# Patient Record
Sex: Female | Born: 1965 | Race: White | Hispanic: No | Marital: Married | Smoking: Never smoker
Health system: Southern US, Community
[De-identification: ages and names within clinical notes are randomized; demographics above are authoritative.]

## PROBLEM LIST (undated history)

## (undated) DIAGNOSIS — K8020 Calculus of gallbladder without cholecystitis without obstruction: Secondary | ICD-10-CM

## (undated) DIAGNOSIS — G4700 Insomnia, unspecified: Secondary | ICD-10-CM

## (undated) DIAGNOSIS — G43839 Menstrual migraine, intractable, without status migrainosus: Secondary | ICD-10-CM

## (undated) DIAGNOSIS — K219 Gastro-esophageal reflux disease without esophagitis: Secondary | ICD-10-CM

## (undated) DIAGNOSIS — M329 Systemic lupus erythematosus, unspecified: Secondary | ICD-10-CM

## (undated) DIAGNOSIS — F419 Anxiety disorder, unspecified: Secondary | ICD-10-CM

## (undated) DIAGNOSIS — J45909 Unspecified asthma, uncomplicated: Secondary | ICD-10-CM

## (undated) DIAGNOSIS — J309 Allergic rhinitis, unspecified: Secondary | ICD-10-CM

## (undated) HISTORY — DX: Insomnia, unspecified: G47.00

## (undated) HISTORY — DX: Gastro-esophageal reflux disease without esophagitis: K21.9

## (undated) HISTORY — DX: Menstrual migraine, intractable, without status migrainosus: G43.839

## (undated) HISTORY — PX: URETHRAL DILATION: SUR417

## (undated) HISTORY — PX: TONSILLECTOMY: SUR1361

---

## 1998-08-27 ENCOUNTER — Encounter: Admission: RE | Admit: 1998-08-27 | Discharge: 1998-09-03 | Payer: Self-pay | Admitting: Family Medicine

## 2003-01-06 ENCOUNTER — Encounter: Admission: RE | Admit: 2003-01-06 | Discharge: 2003-01-06 | Payer: Self-pay | Admitting: Family Medicine

## 2003-01-06 ENCOUNTER — Other Ambulatory Visit: Admission: RE | Admit: 2003-01-06 | Discharge: 2003-01-06 | Payer: Self-pay | Admitting: Obstetrics and Gynecology

## 2003-01-06 ENCOUNTER — Encounter: Payer: Self-pay | Admitting: Family Medicine

## 2004-01-13 ENCOUNTER — Inpatient Hospital Stay (HOSPITAL_COMMUNITY): Admission: AD | Admit: 2004-01-13 | Discharge: 2004-01-16 | Payer: Self-pay | Admitting: Obstetrics and Gynecology

## 2004-03-22 ENCOUNTER — Other Ambulatory Visit: Admission: RE | Admit: 2004-03-22 | Discharge: 2004-03-22 | Payer: Self-pay | Admitting: Obstetrics and Gynecology

## 2004-09-16 ENCOUNTER — Ambulatory Visit: Payer: Self-pay | Admitting: Family Medicine

## 2005-04-06 ENCOUNTER — Other Ambulatory Visit: Admission: RE | Admit: 2005-04-06 | Discharge: 2005-04-06 | Payer: Self-pay | Admitting: Obstetrics and Gynecology

## 2006-04-02 ENCOUNTER — Ambulatory Visit: Payer: Self-pay | Admitting: Family Medicine

## 2006-10-29 ENCOUNTER — Encounter: Payer: Self-pay | Admitting: Family Medicine

## 2006-10-29 DIAGNOSIS — R1011 Right upper quadrant pain: Secondary | ICD-10-CM | POA: Insufficient documentation

## 2006-10-30 ENCOUNTER — Encounter: Admission: RE | Admit: 2006-10-30 | Discharge: 2006-10-30 | Payer: Self-pay | Admitting: Family Medicine

## 2006-10-30 ENCOUNTER — Encounter: Payer: Self-pay | Admitting: Family Medicine

## 2007-07-16 ENCOUNTER — Ambulatory Visit: Payer: Self-pay | Admitting: Family Medicine

## 2007-07-16 DIAGNOSIS — G43839 Menstrual migraine, intractable, without status migrainosus: Secondary | ICD-10-CM | POA: Insufficient documentation

## 2007-07-16 HISTORY — DX: Menstrual migraine, intractable, without status migrainosus: G43.839

## 2007-07-22 ENCOUNTER — Encounter: Payer: Self-pay | Admitting: Family Medicine

## 2007-07-22 DIAGNOSIS — R197 Diarrhea, unspecified: Secondary | ICD-10-CM | POA: Insufficient documentation

## 2007-07-22 DIAGNOSIS — R109 Unspecified abdominal pain: Secondary | ICD-10-CM | POA: Insufficient documentation

## 2007-07-22 LAB — CONVERTED CEMR LAB
Alkaline Phosphatase: 48 units/L (ref 39–117)
Bilirubin, Direct: 0.1 mg/dL (ref 0.0–0.3)
Tissue Transglutaminase Ab, IgA: 0.8 units (ref <7)
Total Protein: 7.3 g/dL (ref 6.0–8.3)

## 2007-08-27 ENCOUNTER — Encounter: Payer: Self-pay | Admitting: Family Medicine

## 2008-05-29 HISTORY — PX: BREAST SURGERY: SHX581

## 2008-06-18 ENCOUNTER — Encounter: Admission: RE | Admit: 2008-06-18 | Discharge: 2008-06-18 | Payer: Self-pay | Admitting: Obstetrics and Gynecology

## 2008-07-07 ENCOUNTER — Encounter (INDEPENDENT_AMBULATORY_CARE_PROVIDER_SITE_OTHER): Payer: Self-pay | Admitting: Plastic Surgery

## 2008-07-07 ENCOUNTER — Ambulatory Visit (HOSPITAL_BASED_OUTPATIENT_CLINIC_OR_DEPARTMENT_OTHER): Admission: RE | Admit: 2008-07-07 | Discharge: 2008-07-08 | Payer: Self-pay | Admitting: Plastic Surgery

## 2009-05-16 ENCOUNTER — Telehealth: Payer: Self-pay | Admitting: Family Medicine

## 2009-06-29 ENCOUNTER — Encounter: Payer: Self-pay | Admitting: Family Medicine

## 2009-06-29 ENCOUNTER — Telehealth: Payer: Self-pay | Admitting: Family Medicine

## 2009-06-29 DIAGNOSIS — S86819A Strain of other muscle(s) and tendon(s) at lower leg level, unspecified leg, initial encounter: Secondary | ICD-10-CM

## 2009-06-29 DIAGNOSIS — S838X9A Sprain of other specified parts of unspecified knee, initial encounter: Secondary | ICD-10-CM | POA: Insufficient documentation

## 2009-07-01 ENCOUNTER — Encounter: Payer: Self-pay | Admitting: Family Medicine

## 2009-07-01 ENCOUNTER — Encounter: Admission: RE | Admit: 2009-07-01 | Discharge: 2009-07-07 | Payer: Self-pay | Admitting: Family Medicine

## 2009-07-05 ENCOUNTER — Encounter: Payer: Self-pay | Admitting: Family Medicine

## 2009-07-06 ENCOUNTER — Ambulatory Visit: Payer: Self-pay | Admitting: Sports Medicine

## 2009-07-06 DIAGNOSIS — M79609 Pain in unspecified limb: Secondary | ICD-10-CM | POA: Insufficient documentation

## 2009-11-23 ENCOUNTER — Encounter: Payer: Self-pay | Admitting: Family Medicine

## 2010-01-04 ENCOUNTER — Encounter (INDEPENDENT_AMBULATORY_CARE_PROVIDER_SITE_OTHER): Payer: Self-pay | Admitting: *Deleted

## 2010-01-12 ENCOUNTER — Telehealth: Payer: Self-pay | Admitting: Family Medicine

## 2010-01-25 ENCOUNTER — Encounter (INDEPENDENT_AMBULATORY_CARE_PROVIDER_SITE_OTHER): Payer: Self-pay | Admitting: *Deleted

## 2010-01-25 ENCOUNTER — Ambulatory Visit: Payer: Self-pay | Admitting: Family Medicine

## 2010-01-25 DIAGNOSIS — M25569 Pain in unspecified knee: Secondary | ICD-10-CM | POA: Insufficient documentation

## 2010-01-27 ENCOUNTER — Ambulatory Visit: Payer: Self-pay | Admitting: Sports Medicine

## 2010-01-27 DIAGNOSIS — R269 Unspecified abnormalities of gait and mobility: Secondary | ICD-10-CM | POA: Insufficient documentation

## 2010-02-17 ENCOUNTER — Ambulatory Visit: Payer: Self-pay | Admitting: Sports Medicine

## 2010-04-18 ENCOUNTER — Telehealth: Payer: Self-pay | Admitting: Family Medicine

## 2010-04-18 ENCOUNTER — Encounter: Payer: Self-pay | Admitting: Family Medicine

## 2010-06-28 NOTE — Miscellaneous (Signed)
Summary: PT Initial Summary/[facility]  PT Initial Summary/[facility]   Imported By: Lanelle Bal 07/07/2009 11:49:32  _____________________________________________________________________  External Attachment:    Type:   Image     Comment:   External Document

## 2010-06-28 NOTE — Assessment & Plan Note (Signed)
Summary: ORTHOTICS,MC   Vital Signs:  Patient profile:   45 year old female Pulse rate:   81 / minute BP sitting:   108 / 74  (right arm) CC: orthotics- knee pain   CC:  orthotics- knee pain.  History of Present Illness: Madison Nguyen returns for orthotics has bilat knee pain w running or w dance activities we placed her in temp orthoitcs and in patellar straps these help however for dance feels she needs more support for feet to lessen sxs  Allergies: 1)  ! Pcn 2)  ! Iodine  Physical Exam  General:  Well-developed,well-nourished,in no acute distress; alert,appropriate and cooperative throughout examination Msk:  valgus shift of RT knee pronation bilat  RT > LT early bunion shift of great toe bilat gait shows horizontal translation of knees bilat   Impression & Recommendations:  Problem # 1:  ABNORMALITY OF GAIT (ICD-781.2)  Patient was fitted for a standard, cushioned, semi-rigid orthotic.  The orthotic was heated and the patient stood on the orthotic blank positioned on the orthotic stand. The patient was positioned in subtalar neutral position and 10 degrees of ankle dorsiflexion in a weight bearing stance. After completion of molding a stable based was applied to the orthotic blank.   The blank was ground to a stable position for weight bearing. size 7 black stripe base blue med eva posting  none additional orthotic padding  none  reck in 3 to 4 mos  time 40 mins  Orders: [employer] Materials, each unit [phone])  Problem # 2:  KNEE PAIN, BILATERAL (ICD-719.46)  hopefully will get more support w orthotics  note genu valgus is improved and gait shows much less pronation and horizontal shift of knees p completion of these  cont knee straps exercises  Orders: Orthotic Materials, each unit (H4742)  Complete Medication List: 1)  Madison Nguyen 0.1-20 Mg-mcg Tabs (Levonorgestrel-ethinyl estrad) .Marland Kitchen.. 1 by mouth once daily 2)  Omeprazole 20 Mg Cpdr (Omeprazole) .Marland Kitchen.. 1  by mouth once daily 3)  Klonopin 0.5 Mg Tabs (Clonazepam) .Marland Kitchen.. 1 by mouth at bedtime as needed 4)  Zyrtec Allergy 10 Mg Tbdp (Cetirizine hcl) .Marland Kitchen.. 1 by mouth once daily in allergy season 5)  Neurontin 300 Mg Caps (Gabapentin) .Marland Kitchen.. 1 by mouth at bedtime for migraine prophylaxis

## 2010-06-28 NOTE — Miscellaneous (Signed)
Summary: PPD  PPD   Imported By: Beau Fanny 11/25/2009 15:28:34  _____________________________________________________________________  External Attachment:    Type:   Image     Comment:   External Document

## 2010-06-28 NOTE — Miscellaneous (Signed)
Summary: Discharge Summary for [facility]  Discharge Summary for [facility]   Imported By: Beau Fanny 07/14/2009 14:55:39  _____________________________________________________________________  External Attachment:    Type:   Image     Comment:   External Document

## 2010-06-28 NOTE — Progress Notes (Signed)
  Phone Note Call from Patient   Summary of Call: need a px for neurontin for migraine prophylaxis to last until I go to headache clinic on sept 2nd  is for neurontin (gabapentin generic is fine ) 300 mg 1 by mouth at bedtime -- #30 is fine  thanks so much  please send it to [facility] pharmacy  I will run out on friday-- thanks!    New/Updated Medications: LESSINA-28 0.1-20 MG-MCG TABS (LEVONORGESTREL-ETHINYL ESTRAD) 1 by mouth once daily OMEPRAZOLE 20 MG CPDR (OMEPRAZOLE) 1 by mouth once daily KLONOPIN 0.5 MG TABS (CLONAZEPAM) 1 by mouth at bedtime as needed ZYRTEC ALLERGY 10 MG TBDP (CETIRIZINE HCL) 1 by mouth once daily in allergy season NEURONTIN 300 MG CAPS (GABAPENTIN) 1 by mouth at bedtime for migraine prophylaxis Prescriptions: NEURONTIN 300 MG CAPS (GABAPENTIN) 1 by mouth at bedtime for migraine prophylaxis  #60 x 3   Entered by:   Ruthe Mannan MD   Authorized by:   Judith Part MD   Signed by:   Ruthe Mannan MD on 01/12/2010   Method used:   Electronically to        [facility]* (retail)       [address]       [place], [place]  [address]       Ph: [phone]       Fax: [phone]   RxID:   [id number]

## 2010-06-28 NOTE — Assessment & Plan Note (Signed)
Summary: NURSE VISIT FOR XRAY  Nurse Visit   Allergies: 1)  ! Pcn 2)  ! Iodine  Orders Added: 1)  T-Knee Left 2 view [73560TC] 2)  T-Knee Right 2 view [73560TC]

## 2010-06-28 NOTE — Letter (Signed)
Summary: Nadara Eaton letter  [facility]  [address] [place], [place] [address]   Phone: [phone]  Fax: [phone]       01/04/2010 MRN: [id number]  [facility] [address] [place], [place]  [address]  Dear Ms. Tr[facility] [place], and [facility] announce the retirement of Arta Silence, M.D., from full-time practice at the [facility] office effective November 25, 2009 and his plans of returning part-time.  It is important to Dr. Hetty Ely and to our practice that you understand that [facility] has seven physicians in our office for your health care needs.  We will continue to offer the same exceptional care that you have today.    Dr. Hetty Ely has spoken to many of you about his plans for retirement and returning part-time in the fall.   We will continue to work with you through the transition to schedule appointments for you in the office and meet the high standards that [facility] is committed to.   Again, it is with great pleasure that we share the news that Dr. Hetty Ely will return to [facility] in October of 2011 with a reduced schedule.    If you have any questions, or would like to request an appointment with one of our physicians, please call us at [phone] and press the option for Scheduling an appointment.  We take pleasure in providing you with excellent patient care and look forward to seeing you at your next office visit.  Our [facility] Physicians are:  Tillman Abide, M.D. Laurita Quint, M.D. Roxy Manns, M.D. Kerby Nora, M.D. Hannah Beat, M.D. Ruthe Mannan, M.D. We proudly welcomed Raechel Ache, M.D. and Eustaquio Boyden, M.D. to the practice in July/August 2011.  Sincerely,   [facility]

## 2010-06-28 NOTE — Assessment & Plan Note (Signed)
Summary: KNEE PAIN,MC   Vital Signs:  Patient profile:   45 year old female BP sitting:   107 / 75  Vitals Entered By: Lillia Pauls CMA (January 27, 2010 2:32 PM)  History of Present Illness: Bilat knee pain hurts up and down stairs no running for a number of years lots of dance classes and competition when young now gets pain at [employer] would like to run some againa nd maybe do dance  swim OK exer bike OK  wants to fast walk w dog  Allergies: 1)  ! Pcn 2)  ! Iodine  Family History: Mother has SLE patient has + ANA 1:1280  Social History: works in primary care MD at [employer]  Physical Exam  General:  Well-developed,well-nourished,in no acute distress; alert,appropriate and cooperative throughout examination Msk:  RT and LT knee exam shows no effusion; stable ligaments; negative Mcmurray's and provocative meniscal tests; non painful patellar compression; patellar and quadriceps tendons unremarkable. crepitation on RT and more on left  some genu valgum  on RT leg length essentailly norm  gait shows pronation even in sports insoles and horizontal tracking of patella bilat  exc hip abduction and quad strength  standing pronation    Impression & Recommendations:  Problem # 1:  KNEE PAIN, BILATERAL (ICD-719.46)  Orders: Garment,belt,sleeve or other covering ,elastic or similar stretch (N0272)   consistent w PFS use pateallr strap recumbent bike grad inc exercise  Problem # 2:  ABNORMALITY OF GAIT (ICD-781.2) meeds custom orthtoics before running or a lot of dance must control pronation  will schedule in 1 mo  Complete Medication List: 1)  Lessina-28 0.1-20 Mg-mcg Tabs (Levonorgestrel-ethinyl estrad) .Marland Kitchen.. 1 by mouth once daily 2)  Omeprazole 20 Mg Cpdr (Omeprazole) .Marland Kitchen.. 1 by mouth once daily 3)  Klonopin 0.5 Mg Tabs (Clonazepam) .Marland Kitchen.. 1 by mouth at bedtime as needed 4)  Zyrtec Allergy 10 Mg Tbdp (Cetirizine hcl) .Marland Kitchen.. 1 by mouth once daily in  allergy season 5)  Neurontin 300 Mg Caps (Gabapentin) .Marland Kitchen.. 1 by mouth at bedtime for migraine prophylaxis  Patient Instructions: 1)  get a regular routine on recumbent bike 2)  use patellar straps routinely for exercise 3)  ice toward of day 4)  1 set of 15 lateral leg lifts 5)  until custom orthotics don't start run program 6)  OK to dance with straps in place 7)  return for orthotics

## 2010-06-28 NOTE — Assessment & Plan Note (Signed)
Summary: 2:45 APPT,R CALF TEAR,MC   Vital Signs:  Patient profile:   45 year old female Height:      63 inches Weight:      154 pounds BMI:     27.38 BP sitting:   119 / 81  Vitals Entered By: Lillia Pauls CMA (July 06, 2009 3:08 PM)  History of Present Illness: 45 yo F here for left calf pain  Patient states she was in dance class about a week and a half ago. Went to step backwards and person in front of her fell backwards onto her. She felt a pop in the midsubstance of her medial left calf with immediate pain and swelling/bruising. Was unable to walk immediately and now still very difficult to do so. Using crutches for ambulation and toe walking on that side.  Allergies (verified): 1)  ! Pcn 2)  ! Iodine  Physical Exam  General:  Well-developed,well-nourished,in no acute distress; alert,appropriate and cooperative throughout examination Msk:  L leg: Bruising noted about left heel medially and laterally Calf feels firm and more full compared to right calf No TTP achilles or proximal gastroc tendons at insertions. Severe TTP mid-substance medial gastroc muscle. No palpable deformity within muscle - difficult to assess d/t swelling Negative thompsons. Unable to step foot down flat on floor Additional Exam:  MSK u/s:  Significant fluid collection surrounding medial gastroc muscle.  Disruption of normal gastroc fibers within mid-portion of medial gastroc.  R medial gastroc appears normal.  L achilles normal.  L hamstring and proximal gastroc tendons also normal.  Images saved for documentation.   Impression & Recommendations:  Problem # 1:  CALF PAIN, LEFT (ICD-729.5) Assessment New Mid-substance tear of left medial gastroc.  Calf sleeve for compression.  Sports insoles with heel lift on left given and advised to try to walk without toe walking if possible.  When comfortable, start doing eccentric calf exercise on a book and advance to calf raises on stair eventually.   Ice and nsaids.  Will f/u in 2 weeks for reevaluation and rescan.  Orders: Garment,belt,sleeve or other covering ,elastic or similar stretch (Z6109)  Complete Medication List: 1)  Alprazolam 0.5 Mg Tabs (Alprazolam) .Marland Kitchen.. 1-2 tabs by mouth at night as needed insomnia  Patient Instructions: 1)  Wear the heel lift and insoles with your shoes. 2)  Wear the calf sleeve for compression. 3)  Continue icing your calf given the amount of swelling you have - 3-4 times a day for 15 minutes at a time. 4)  When you are able to, start eccentric stretches of your left calf (lowering down) - start on both feet, 3 sets of 6 initially and increase eventually to 3 sets of 15 lowering AND raising on your left leg only, eventually on a stair. 5)  Follow up with [place] in 2 weeks to rescan your calf.

## 2010-06-28 NOTE — Letter (Signed)
Summary: [place] / DISCHARGE SUMMARY FOR PT SERVICES / [facility]  [place] / DISCHARGE SUMMARY FOR PT SERVICES / [facility]   Imported By: Carin Primrose 07/12/2009 15:52:29  _____________________________________________________________________  External Attachment:    Type:   Image     Comment:   External Document

## 2010-06-28 NOTE — Miscellaneous (Signed)
  Clinical Lists Changes  Orders: Added new Referral order of Physical Therapy Referral (PT) - Signed 

## 2010-06-28 NOTE — Progress Notes (Signed)
  Phone Note Call from Patient   Summary of Call: Discussed the case with the patient and examined in the office, she has evidence of a medial gastroc partial rupture. Reviewed management with her face to face. Recommending PT and will make this referral. Initial call taken by: Hannah Beat MD,  June 29, 2009 12:02 PM  New Problems: MUSCLE STRAIN, RIGHT CALF (ICD-844.8)   New Problems: MUSCLE STRAIN, RIGHT CALF (ICD-844.8)

## 2010-06-28 NOTE — Progress Notes (Signed)
  Phone Note Outgoing Call   Summary of Call: Please call in phenergan 25mg  by mouth three times a day as needed nausea and vomiting.  #30, 1rf.  Initial call taken by: Crawford Givens MD,  April 18, 2010 10:54 AM  Follow-up for Phone Call        I phoned her home asking her to call or flag me with her pharmacy choice.  Lugene Fuquay CMA (AAMA)  April 18, 2010 10:59 AM   [facility] [address]  Medication phoned to pharmacy. Lugene Fuquay CMA (AAMA)  April 18, 2010 1:52 PM     New/Updated Medications: PROMETHAZINE HCL 25 MG TABS (PROMETHAZINE HCL) Take 1 tablet by mouth three times a day as needed nausea  and vomiting. Prescriptions: PROMETHAZINE HCL 25 MG TABS (PROMETHAZINE HCL) Take 1 tablet by mouth three times a day as needed nausea  and vomiting.  #30 x 1   Entered by:   Delilah Shan CMA (AAMA)   Authorized by:   Crawford Givens MD   Signed by:   Delilah Shan CMA (AAMA) on 04/18/2010   Method used:   Handwritten   RxID:   [id number]

## 2010-06-28 NOTE — Miscellaneous (Signed)
  Clinical Lists Changes 

## 2010-07-20 ENCOUNTER — Encounter: Payer: Self-pay | Admitting: Family Medicine

## 2010-07-26 NOTE — Miscellaneous (Signed)
  Clinical Lists Changes  Medications: Added new medication of DEXILANT 60 MG CPDR (DEXLANSOPRAZOLE) one tab by mouth 30 mins prior to brfst - Signed Rx of DEXILANT 60 MG CPDR (DEXLANSOPRAZOLE) one tab by mouth 30 mins prior to brfst;  #30 x 5;  Signed;  Entered by: Shaune Leeks MD;  Authorized by: Shaune Leeks MD;  Method used: Electronically to [facility]*, [address], [place], [place]  [address], Ph: [phone], Fax: [phone]    Prescriptions: DEXILANT 60 MG CPDR (DEXLANSOPRAZOLE) one tab by mouth 30 mins prior to brfst  #30 x 5   Entered and Authorized by:   Shaune Leeks MD   Signed by:   Shaune Leeks MD on 07/20/2010   Method used:   Electronically to        [facility]* (retail)       [address]       [place], [place]  [address]       Ph: [phone]       Fax: [phone]   RxID:   [id number]

## 2010-10-11 NOTE — Op Note (Signed)
Madison Nguyen, AXELSON                 ACCOUNT NO.:  [id number]   MEDICAL RECORD NO.:  [id number]          PATIENT TYPE:  AMB   LOCATION:  DSC                          FACILITY:  [facility]   PHYSICIAN:  Consuello Bossier., M.D.DATE OF BIRTH:  [birth year]   DATE OF PROCEDURE:  07/07/2008  DATE OF DISCHARGE:                               OPERATIVE REPORT   PREOPERATIVE DIAGNOSIS:  Symptomatic bilateral mammary hypertrophy.   POSTOPERATIVE DIAGNOSIS:  Symptomatic bilateral mammary hypertrophy.   OPERATION:  Bilateral reduction and mammoplasty.   SURGEON:  Pleas Patricia, MD   ASSISTANT:  Etter Sjogren, M.D.   ANESTHESIA:  General endotracheal.   FINDINGS:  The patient had symptomatic bilateral mammary hypertrophy  with discomfort in the breast, shoulder, and back area for which the  above surgical procedure was carried out.   PROCEDURE:  The patient was brought to the operating room having been  marked in the upright position for the planned surgical procedure to  elevate the nipple to 21.5 cm from the sternal notch.  She was given  general endotracheal anesthesia, prepped with Hibiclens draped  sterilely.  Initially, the keyhole area as well as the inferior pedicle  were de-epithelized.  An inframammary incision was made medially and  continued up along the medial aspect of the planned vertical bipedicle  nipple-areolar graft.  A similar procedure was carried out laterally  creating the vertical bipedicle nipple-areolar graft.  An incision was  made in the central pedicle just below the level of the nipple,  continued upward to the nipple leaving a 1 cm in depth superior pedicle.  Following this, a large triangle segment of medial full-thickness breast  tissue was removed in continuity with the central segment as well as an  even larger lateral segment.  Approximate 360 g of tissue were removed  from each breast.  Bleeding was controlled with electrocautery and there  was noted to  be  good hemostasis.  The medial and lateral flaps were  brought together to a predetermined position along the inframammary line  with an interrupted #2-0 Vicryl.  The circumareolar, vertical, and  inframammary incisions were closed with interrupted subcutaneous #3-0  Monocryl followed by running  subcuticular 4-0 Monocryl .  Nipple color was excellent.  Steri-Strips,  Xeroform, plus ABD and light compression dressing were applied.  The  patient tolerated the procedure well.  She was able to be discharged  from the operating room to recovery room, subsequently to be followed in  the [facility] for overnight observation.      Consuello Bossier., M.D.  Electronically Signed     Consuello Bossier., M.D.  Electronically Signed    HH/MEDQ  D:  07/07/2008  T:  07/07/2008  Job:  [id number]

## 2010-10-14 NOTE — H&P (Signed)
Madison Nguyen, Madison Nguyen                             ACCOUNT NO.:  [id number]   MEDICAL RECORD NO.:  [id number]                   PATIENT TYPE:   LOCATION:                                       FACILITY:   PHYSICIAN:  Duke Salvia. Marcelle Overlie, M.D.            DATE OF BIRTH:   DATE OF ADMISSION:  DATE OF DISCHARGE:                                HISTORY & PHYSICAL   CHIEF COMPLAINT:  For labor induction at term.   HISTORY OF PRESENT ILLNESS:  A 45 year old G1, P0.  EDD is August 21.  Admitted for two-stage labor induction.  Her prenatal course has been  uneventful.  She underwent early pregnancy trisomy-21 screening at [facility] with normal findings and subsequently underwent amniocentesis with  normal findings.  One-hour GTT was 112.  She does have a positive group B  Strep screen.  Her blood type is A+.   ALLERGIES:  PENICILLIN, IODINE DYE.   PAST SURGICAL HISTORY:  1. Tonsillectomy.  2. Urethral dilatation.   FAMILY HISTORY:  As listed on [facility].   PHYSICAL EXAMINATION:  VITAL SIGNS:  Temperature 98.2, blood pressure  100/62.  HEENT:  Unremarkable.  NECK:  Supple without mass.  LUNGS:  Clear.  CARDIOVASCULAR:  Regular rate and rhythm without murmurs, rubs, or gallops.  BREASTS:  Not examined.  ABDOMEN:  Term fundal height.  Fetal heart rate 140.  PELVIC:  Cervix was 1, 50%, vertex, -2.  Ultrasound on August 12 showed an  EFW of 8 pounds 2 ounces.   IMPRESSION:  Term intrauterine pregnancy.   PLAN:  Two-stage labor induction.  Will need GBS prophylaxis in labor.                                               Richard M. Marcelle Overlie, M.D.    RMH/MEDQ  D:  01/13/2004  T:  01/13/2004  Job:  [id number]

## 2011-02-21 ENCOUNTER — Telehealth: Payer: Self-pay | Admitting: Family Medicine

## 2011-02-21 NOTE — Telephone Encounter (Signed)
Examined patient in the office. Exam and history c/w ethmoid and max sinusitis.  Sent in LVQ 500 mg, 1 po daily x 10 to [facility]. Called in. For documentation, script sent in last week. Checked on patient, who is now improved.

## 2012-06-20 ENCOUNTER — Other Ambulatory Visit: Payer: Self-pay | Admitting: Obstetrics and Gynecology

## 2012-08-23 ENCOUNTER — Other Ambulatory Visit: Payer: Self-pay | Admitting: Family Medicine

## 2012-08-23 LAB — COMPREHENSIVE METABOLIC PANEL
Albumin: 3.3 g/dL — ABNORMAL LOW (ref 3.5–5.2)
Alkaline Phosphatase: 65 U/L (ref 39–117)
CO2: 25 mEq/L (ref 19–32)
Calcium: 8.4 mg/dL (ref 8.4–10.5)
Chloride: 103 mEq/L (ref 96–112)
GFR: 78.02 mL/min (ref >60.00)
Glucose, Bld: 77 mg/dL (ref 70–99)
Potassium: 3.3 mEq/L — ABNORMAL LOW (ref 3.5–5.1)
Sodium: 135 mEq/L (ref 135–145)
Total Protein: 6.3 g/dL (ref 6.0–8.3)

## 2012-08-23 LAB — CBC WITH DIFFERENTIAL/PLATELET
Basophils Absolute: 0 10*3/uL (ref 0.0–0.1)
Eosinophils Absolute: 0.2 10*3/uL (ref 0.0–0.7)
HCT: 38 % (ref 36.0–46.0)
Hemoglobin: 12.9 g/dL (ref 12.0–15.0)
Lymphs Abs: 0.7 10*3/uL (ref 0.7–4.0)
MCHC: 34 g/dL (ref 30.0–36.0)
MCV: 83.3 fl (ref 78.0–100.0)
Monocytes Absolute: 0.4 10*3/uL (ref 0.1–1.0)
Neutro Abs: 5.6 10*3/uL (ref 1.4–7.7)
RDW: 13.5 % (ref 11.5–14.6)

## 2012-08-23 LAB — LIPID PANEL
Cholesterol: 151 mg/dL (ref 0–200)
LDL Cholesterol: 81 mg/dL (ref 0–99)

## 2012-09-06 ENCOUNTER — Other Ambulatory Visit: Payer: Self-pay | Admitting: Family Medicine

## 2012-09-06 MED ORDER — AZELASTINE HCL 0.1 % NA SOLN: 2.0000 | Freq: Two times a day (BID) | NASAL | Status: DC

## 2013-07-03 ENCOUNTER — Other Ambulatory Visit: Payer: Self-pay | Admitting: Obstetrics and Gynecology

## 2013-10-08 ENCOUNTER — Telehealth: Payer: Self-pay | Admitting: Family Medicine

## 2013-10-08 DIAGNOSIS — R1011 Right upper quadrant pain: Secondary | ICD-10-CM

## 2013-10-08 NOTE — Telephone Encounter (Signed)
Several month h/o RUQ abd pain radiating to R shoulderblade. On dexilant for GERD. Will order RUQ limited abd [place] to eval gallstones.

## 2013-10-09 ENCOUNTER — Ambulatory Visit
Admission: RE | Admit: 2013-10-09 | Discharge: 2013-10-09 | Disposition: A | Discharge status: Home / Self Care | Payer: 59 | Source: Ambulatory Visit | Attending: Family Medicine | Admitting: Family Medicine

## 2013-10-09 DIAGNOSIS — R1011 Right upper quadrant pain: Secondary | ICD-10-CM

## 2013-10-10 ENCOUNTER — Encounter: Payer: Self-pay | Admitting: Family Medicine

## 2013-10-10 ENCOUNTER — Other Ambulatory Visit: Payer: Self-pay

## 2013-10-10 ENCOUNTER — Other Ambulatory Visit: Payer: Self-pay | Admitting: Family Medicine

## 2013-10-10 DIAGNOSIS — R1011 Right upper quadrant pain: Secondary | ICD-10-CM

## 2013-10-10 DIAGNOSIS — K8020 Calculus of gallbladder without cholecystitis without obstruction: Secondary | ICD-10-CM

## 2013-10-22 ENCOUNTER — Encounter (INDEPENDENT_AMBULATORY_CARE_PROVIDER_SITE_OTHER): Payer: Self-pay | Admitting: Surgery

## 2013-10-22 ENCOUNTER — Ambulatory Visit (INDEPENDENT_AMBULATORY_CARE_PROVIDER_SITE_OTHER): Payer: Commercial Managed Care - PPO | Admitting: Surgery

## 2013-10-22 VITALS — BP 112/70 | HR 74 | Ht 63.0 in | Wt 148.6 lb

## 2013-10-22 DIAGNOSIS — K8020 Calculus of gallbladder without cholecystitis without obstruction: Secondary | ICD-10-CM | POA: Insufficient documentation

## 2013-10-22 NOTE — Patient Instructions (Signed)
Laparoscopic Cholecystectomy, Care After Refer to this sheet in the next few weeks. These instructions provide you with information on caring for yourself after your procedure. Your health care provider may also give you more specific instructions. Your treatment has been planned according to current medical practices, but problems sometimes occur. Call your health care provider if you have any problems or questions after your procedure. WHAT TO EXPECT AFTER THE PROCEDURE After your procedure, it is typical to have the following:  Pain at your incision sites. You will be given pain medicines to control the pain.  Mild nausea or vomiting. This should improve after the first 24 hours.  Bloating and possibly shoulder pain from the gas used during the procedure. This will improve after the first 24 hours. HOME CARE INSTRUCTIONS   Change bandages (dressings) as directed by your health care provider.  Keep the wound dry and clean. You may wash the wound gently with soap and water. Gently blot or dab the area dry.  Do not take baths or use swimming pools or hot tubs for 2 weeks or until your health care provider approves.  Only take over-the-counter or prescription medicines as directed by your health care provider.  Continue your normal diet as directed by your health care provider.  Do not lift anything heavier than 10 pounds (4.5 kg) until your health care provider approves.  Do not play contact sports for 1 week or until your health care provider approves. SEEK MEDICAL CARE IF:   You have redness, swelling, or increasing pain in the wound.  You notice yellowish-white fluid (pus) coming from the wound.  You have drainage from the wound that lasts longer than 1 day.  You notice a bad smell coming from the wound or dressing.  Your surgical cuts (incisions) break open. SEEK IMMEDIATE MEDICAL CARE IF:   You develop a rash.  You have difficulty breathing.  You have chest pain.  You  have a fever.  You have increasing pain in the shoulders (shoulder strap areas).  You have dizzy episodes or faint while standing.  You have severe abdominal pain.  You feel sick to your stomach (nauseous) or throw up (vomit) and this lasts for more than 1 day. Document Released: 05/15/2005 Document Revised: 03/05/2013 Document Reviewed: 12/25/2012 [facility] Patient Information 2014 [place]. Laparoscopic Cholecystectomy Laparoscopic cholecystectomy is surgery to remove the gallbladder. The gallbladder is located in the upper right part of the abdomen, behind the liver. It is a storage sac for bile produced in the liver. Bile aids in the digestion and absorption of fats. Cholecystectomy is often done for inflammation of the gallbladder (cholecystitis). This condition is usually caused by a buildup of gallstones (cholelithiasis) in your gallbladder. Gallstones can block the flow of bile, resulting in inflammation and pain. In severe cases, emergency surgery may be required. When emergency surgery is not required, you will have time to prepare for the procedure. Laparoscopic surgery is an alternative to open surgery. Laparoscopic surgery has a shorter recovery time. Your common bile duct may also need to be examined during the procedure. If stones are found in the common bile duct, they may be removed. LET [facility] CARE PROVIDER KNOW ABOUT:  Any allergies you have.  All medicines you are taking, including vitamins, herbs, eye drops, creams, and over-the-counter medicines.  Previous problems you or members of your family have had with the use of anesthetics.  Any blood disorders you have.  Previous surgeries you have had.  Medical  conditions you have. RISKS AND COMPLICATIONS Generally, this is a safe procedure. However, as with any procedure, complications can occur. Possible complications include:  Infection.  Damage to the common bile duct, nerves, arteries, veins, or other  internal organs such as the stomach, liver, or intestines.  Bleeding.  A stone may remain in the common bile duct.  A bile leak from the cyst duct that is clipped when your gallbladder is removed.  The need to convert to open surgery, which requires a larger incision in the abdomen. This may be necessary if your surgeon thinks it is not safe to continue with a laparoscopic procedure. BEFORE THE PROCEDURE  Ask your health care provider about changing or stopping any regular medicines. You will need to stop taking aspirin or blood thinners at least 5 days prior to surgery.  Do not eat or drink anything after midnight the night before surgery.  Let your health care provider know if you develop a cold or other infectious problem before surgery. PROCEDURE   You will be given medicine to make you sleep through the procedure (general anesthetic). A breathing tube will be placed in your mouth.  When you are asleep, your surgeon will make several small cuts (incisions) in your abdomen.  A thin, lighted tube with a tiny camera on the end (laparoscope) is inserted through one of the small incisions. The camera on the laparoscope sends a picture to a TV screen in the operating room. This gives the surgeon a good view inside your abdomen.  A gas will be pumped into your abdomen. This expands your abdomen so that the surgeon has more room to perform the surgery.  Other tools needed for the procedure are inserted through the other incisions. The gallbladder is removed through one of the incisions.  After the removal of your gallbladder, the incisions will be closed with stitches, staples, or skin glue. AFTER THE PROCEDURE  You will be taken to a recovery area where your progress will be checked often.  You may be allowed to go home the same day if your pain is controlled and you can tolerate liquids. Document Released: 05/15/2005 Document Revised: 03/05/2013 Document Reviewed:  12/25/2012 [facility] Patient Information 2014 [place].

## 2013-10-22 NOTE — Progress Notes (Signed)
Patient ID: Madison A Heinkel, MD, female   DOB: 09/16/1965, 47 y.o.   MRN: [id number]  No chief complaint on file.   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  Pt sent at the request of Dr Guitierez for abdominal pain and gallbladder disease.  Pt has had intermittent symptoms of RUQ with fatty foods. On and off for months.  Radiation to right shoulder.  HPI  Past Medical History  Diagnosis Date  . MENST MIGRAINE W/INTRACT W/O STATUS MIGRAINOSUS 07/16/2007    Qualifier: Diagnosis of  By: Schaller MD, Robert Neal   . GERD (gastroesophageal reflux disease)     Past Surgical History  Procedure Laterality Date  . Breast surgery      reduction  . Urethral dilation    . Tonsillectomy      Family History  Problem Relation Age of Onset  . Breast cancer Paternal Aunt     Social History History  Substance Use Topics  . Smoking status: Never Smoker   . Smokeless tobacco: Not on file  . Alcohol Use: Not on file    Allergies  Allergen Reactions  . Iodine   . Penicillins     Current Outpatient Prescriptions  Medication Sig Dispense Refill  . cetirizine (ZYRTEC) 10 MG tablet Take 10 mg by mouth daily.      . clonazePAM (KLONOPIN) 0.5 MG tablet Take 0.5 mg by mouth 2 (two) times daily as needed for anxiety.      . dexlansoprazole (DEXILANT) 60 MG capsule Take 60 mg by mouth daily.      . Eletriptan Hydrobromide (RELPAX PO) Take by mouth as needed.      . montelukast (SINGULAIR) 10 MG tablet Take 10 mg by mouth at bedtime.      . naproxen sodium (ANAPROX) 220 MG tablet Take 220 mg by mouth as needed.      . Norethindrone-Eth Estradiol (BALZIVA PO) Take by mouth.      . azelastine (ASTELIN) 137 MCG/SPRAY nasal spray Place 2 sprays into the nose 2 (two) times daily. Use in each nostril as directed  30 mL  3   No current facility-administered medications for this visit.    Review of Systems Review of Systems  Respiratory: Negative.   Cardiovascular: Negative.   Gastrointestinal:  Positive for abdominal pain.  Musculoskeletal: Positive for gait problem.  Psychiatric/Behavioral: Negative.     Blood pressure 112/70, pulse 74, height 5' 3" (1.6 m), weight 148 lb 9.6 oz (67.405 kg).  Physical Exam Physical Exam  Constitutional: She is oriented to person, place, and time. She appears well-developed and well-nourished.  Eyes: No scleral icterus.  Cardiovascular: Normal rate and regular rhythm.   Pulmonary/Chest: Effort normal and breath sounds normal.  Abdominal: Soft. Bowel sounds are normal. She exhibits no distension. There is no tenderness.  Neurological: She is alert and oriented to person, place, and time.  Skin: Skin is warm and dry.  Psychiatric: She has a normal mood and affect. Her behavior is normal. Judgment and thought content normal.    Data Reviewed Gallstones no CBD dilation no wall thickening.   Assessment    Symptomatic cholelithiasis    Plan    Recommend laparoscopic cholecystectomy and cholangiogram.  The procedure has been discussed with the patient. Operative and non operative treatments have been discussed. Risks of surgery include bleeding, infection,  Common bile duct injury,  Injury to the stomach,liver, colon,small intestine, abdominal wall,  Diaphragm,  Major blood vessels,  And   the need for an open procedure.  Other risks include worsening of medical problems, death,  DVT and pulmonary embolism, and cardiovascular events.   Medical options have also been discussed. The patient has been informed of long term expectations of surgery and non surgical options,  The patient agrees to proceed.         Terryon Pineiro A. Torien Ramroop 10/22/2013, 8:53 AM    

## 2013-10-27 HISTORY — PX: LAPAROSCOPIC CHOLECYSTECTOMY: SUR755

## 2013-11-04 ENCOUNTER — Other Ambulatory Visit (INDEPENDENT_AMBULATORY_CARE_PROVIDER_SITE_OTHER): Payer: 59

## 2013-11-04 ENCOUNTER — Other Ambulatory Visit: Payer: Self-pay | Admitting: Family Medicine

## 2013-11-04 DIAGNOSIS — M329 Systemic lupus erythematosus, unspecified: Secondary | ICD-10-CM

## 2013-11-04 LAB — CBC WITH DIFFERENTIAL/PLATELET
BASOS ABS: 0 10*3/uL (ref 0.0–0.1)
Basophils Relative: 0.6 % (ref 0.0–3.0)
EOS ABS: 0.1 10*3/uL (ref 0.0–0.7)
Eosinophils Relative: 1.2 % (ref 0.0–5.0)
HEMATOCRIT: 38.6 % (ref 36.0–46.0)
Hemoglobin: 12.9 g/dL (ref 12.0–15.0)
LYMPHS ABS: 1.2 10*3/uL (ref 0.7–4.0)
LYMPHS PCT: 16.9 % (ref 12.0–46.0)
MCHC: 33.5 g/dL (ref 30.0–36.0)
MCV: 85.9 fl (ref 78.0–100.0)
MONOS PCT: 4.5 % (ref 3.0–12.0)
Monocytes Absolute: 0.3 10*3/uL (ref 0.1–1.0)
NEUTROS ABS: 5.5 10*3/uL (ref 1.4–7.7)
Neutrophils Relative %: 76.8 % (ref 43.0–77.0)
PLATELETS: 157 10*3/uL (ref 150.0–400.0)
RBC: 4.49 Mil/uL (ref 3.87–5.11)
RDW: 14.2 % (ref 11.5–15.5)
WBC: 7.2 10*3/uL (ref 4.0–10.5)

## 2013-11-05 ENCOUNTER — Encounter (HOSPITAL_BASED_OUTPATIENT_CLINIC_OR_DEPARTMENT_OTHER): Payer: Self-pay | Admitting: *Deleted

## 2013-11-05 NOTE — Progress Notes (Signed)
Patient works at [employer] and drew her CMET and CBC in office,results in [place].

## 2013-11-07 ENCOUNTER — Other Ambulatory Visit: Payer: Self-pay | Admitting: Family Medicine

## 2013-11-07 ENCOUNTER — Other Ambulatory Visit (INDEPENDENT_AMBULATORY_CARE_PROVIDER_SITE_OTHER): Payer: 59

## 2013-11-07 DIAGNOSIS — K8020 Calculus of gallbladder without cholecystitis without obstruction: Secondary | ICD-10-CM

## 2013-11-08 LAB — COMPREHENSIVE METABOLIC PANEL
ALBUMIN: 3.6 g/dL (ref 3.5–5.2)
ALT: 12 U/L (ref 0–35)
AST: 14 U/L (ref 0–37)
Alkaline Phosphatase: 44 U/L (ref 39–117)
BUN: 9 mg/dL (ref 6–23)
CALCIUM: 8.4 mg/dL (ref 8.4–10.5)
CHLORIDE: 103 meq/L (ref 96–112)
CO2: 25 mEq/L (ref 19–32)
Creat: 0.75 mg/dL (ref 0.50–1.10)
GLUCOSE: 110 mg/dL — AB (ref 70–99)
Potassium: 4.2 mEq/L (ref 3.5–5.3)
SODIUM: 137 meq/L (ref 135–145)
TOTAL PROTEIN: 5.9 g/dL — AB (ref 6.0–8.3)
Total Bilirubin: 0.4 mg/dL (ref 0.2–1.2)

## 2013-11-12 ENCOUNTER — Encounter (HOSPITAL_BASED_OUTPATIENT_CLINIC_OR_DEPARTMENT_OTHER)
Admission: RE | Disposition: A | Discharge status: Home / Self Care | Payer: Self-pay | Source: Ambulatory Visit | Attending: Surgery

## 2013-11-12 ENCOUNTER — Encounter (HOSPITAL_BASED_OUTPATIENT_CLINIC_OR_DEPARTMENT_OTHER): Payer: Self-pay | Admitting: *Deleted

## 2013-11-12 ENCOUNTER — Encounter (HOSPITAL_BASED_OUTPATIENT_CLINIC_OR_DEPARTMENT_OTHER): Payer: 59 | Admitting: Anesthesiology

## 2013-11-12 ENCOUNTER — Ambulatory Visit (HOSPITAL_BASED_OUTPATIENT_CLINIC_OR_DEPARTMENT_OTHER): Payer: 59 | Admitting: Anesthesiology

## 2013-11-12 ENCOUNTER — Ambulatory Visit (HOSPITAL_BASED_OUTPATIENT_CLINIC_OR_DEPARTMENT_OTHER)
Admission: RE | Admit: 2013-11-12 | Discharge: 2013-11-12 | Disposition: A | Discharge status: Home / Self Care | Payer: 59 | Source: Ambulatory Visit | Attending: Surgery | Admitting: Surgery

## 2013-11-12 DIAGNOSIS — F411 Generalized anxiety disorder: Secondary | ICD-10-CM | POA: Insufficient documentation

## 2013-11-12 DIAGNOSIS — K8010 Calculus of gallbladder with chronic cholecystitis without obstruction: Secondary | ICD-10-CM

## 2013-11-12 DIAGNOSIS — K8020 Calculus of gallbladder without cholecystitis without obstruction: Secondary | ICD-10-CM

## 2013-11-12 DIAGNOSIS — Z880 Allergy status to penicillin: Secondary | ICD-10-CM | POA: Insufficient documentation

## 2013-11-12 DIAGNOSIS — Z79899 Other long term (current) drug therapy: Secondary | ICD-10-CM | POA: Insufficient documentation

## 2013-11-12 DIAGNOSIS — G43909 Migraine, unspecified, not intractable, without status migrainosus: Secondary | ICD-10-CM | POA: Insufficient documentation

## 2013-11-12 DIAGNOSIS — K219 Gastro-esophageal reflux disease without esophagitis: Secondary | ICD-10-CM | POA: Insufficient documentation

## 2013-11-12 HISTORY — DX: Calculus of gallbladder without cholecystitis without obstruction: K80.20

## 2013-11-12 HISTORY — PX: CHOLECYSTECTOMY: SHX55

## 2013-11-12 HISTORY — DX: Anxiety disorder, unspecified: F41.9

## 2013-11-12 LAB — GLUCOSE, CAPILLARY: GLUCOSE-CAPILLARY: 101 mg/dL — AB (ref 70–99)

## 2013-11-12 SURGERY — LAPAROSCOPIC CHOLECYSTECTOMY
Anesthesia: General | Site: Abdomen

## 2013-11-12 MED ORDER — CIPROFLOXACIN IN D5W 400 MG/200ML IV SOLN
INTRAVENOUS | Status: AC
  Filled 2013-11-12: qty 200

## 2013-11-12 MED ORDER — FENTANYL CITRATE 0.05 MG/ML IJ SOLN
INTRAMUSCULAR | Status: AC
  Filled 2013-11-12: qty 6

## 2013-11-12 MED ORDER — OXYCODONE HCL 5 MG PO TABS
5.0000 mg | ORAL_TABLET | Freq: Once | ORAL | Status: AC | PRN
  Administered 2013-11-12: 5 mg via ORAL

## 2013-11-12 MED ORDER — HEMOSTATIC AGENTS (NO CHARGE) OPTIME
TOPICAL | Status: DC | PRN
  Administered 2013-11-12: 1 via TOPICAL

## 2013-11-12 MED ORDER — LACTATED RINGERS IV SOLN
INTRAVENOUS | Status: DC
  Administered 2013-11-12 (×3): via INTRAVENOUS

## 2013-11-12 MED ORDER — NEOSTIGMINE METHYLSULFATE 10 MG/10ML IV SOLN
INTRAVENOUS | Status: DC | PRN
  Administered 2013-11-12: 3 mg via INTRAVENOUS

## 2013-11-12 MED ORDER — HYDROMORPHONE HCL PF 1 MG/ML IJ SOLN
INTRAMUSCULAR | Status: AC
  Filled 2013-11-12: qty 1

## 2013-11-12 MED ORDER — ONDANSETRON HCL 4 MG/2ML IJ SOLN
INTRAMUSCULAR | Status: DC | PRN
  Administered 2013-11-12: 4 mg via INTRAVENOUS

## 2013-11-12 MED ORDER — MIDAZOLAM HCL 5 MG/5ML IJ SOLN
INTRAMUSCULAR | Status: DC | PRN
  Administered 2013-11-12: 2 mg via INTRAVENOUS

## 2013-11-12 MED ORDER — MIDAZOLAM HCL 2 MG/2ML IJ SOLN
INTRAMUSCULAR | Status: AC
  Filled 2013-11-12: qty 2

## 2013-11-12 MED ORDER — OXYCODONE HCL 5 MG PO TABS
ORAL_TABLET | ORAL | Status: AC
  Filled 2013-11-12: qty 1

## 2013-11-12 MED ORDER — ROCURONIUM BROMIDE 100 MG/10ML IV SOLN
INTRAVENOUS | Status: DC | PRN
  Administered 2013-11-12: 35 mg via INTRAVENOUS

## 2013-11-12 MED ORDER — SODIUM CHLORIDE 0.9 % IR SOLN
Status: DC | PRN
  Administered 2013-11-12: 1

## 2013-11-12 MED ORDER — OXYCODONE-ACETAMINOPHEN 5-325 MG PO TABS: 1.0000 | ORAL_TABLET | ORAL | Status: DC | PRN

## 2013-11-12 MED ORDER — OXYCODONE HCL 5 MG/5ML PO SOLN: 5.0000 mg | Freq: Once | ORAL | Status: AC | PRN

## 2013-11-12 MED ORDER — PROPOFOL 10 MG/ML IV BOLUS
INTRAVENOUS | Status: DC | PRN
  Administered 2013-11-12: 200 mg via INTRAVENOUS

## 2013-11-12 MED ORDER — HYDROMORPHONE HCL PF 1 MG/ML IJ SOLN
0.2500 mg | INTRAMUSCULAR | Status: DC | PRN
  Administered 2013-11-12 (×4): 0.5 mg via INTRAVENOUS

## 2013-11-12 MED ORDER — BUPIVACAINE-EPINEPHRINE (PF) 0.25% -1:200000 IJ SOLN
INTRAMUSCULAR | Status: AC
  Filled 2013-11-12: qty 30

## 2013-11-12 MED ORDER — GLYCOPYRROLATE 0.2 MG/ML IJ SOLN
INTRAMUSCULAR | Status: DC | PRN
  Administered 2013-11-12: .4 mg via INTRAVENOUS

## 2013-11-12 MED ORDER — CHLORHEXIDINE GLUCONATE 4 % EX LIQD: 1.0000 "application " | Freq: Once | CUTANEOUS | Status: DC

## 2013-11-12 MED ORDER — FENTANYL CITRATE 0.05 MG/ML IJ SOLN
INTRAMUSCULAR | Status: DC | PRN
  Administered 2013-11-12: 100 ug via INTRAVENOUS
  Administered 2013-11-12 (×2): 50 ug via INTRAVENOUS

## 2013-11-12 MED ORDER — BUPIVACAINE-EPINEPHRINE 0.25% -1:200000 IJ SOLN
INTRAMUSCULAR | Status: DC | PRN
  Administered 2013-11-12: 7 mL

## 2013-11-12 MED ORDER — CIPROFLOXACIN IN D5W 400 MG/200ML IV SOLN
400.0000 mg | INTRAVENOUS | Status: AC
Start: 2013-11-13 — End: 2013-11-12
  Administered 2013-11-12: 400 mg via INTRAVENOUS

## 2013-11-12 MED ORDER — FENTANYL CITRATE 0.05 MG/ML IJ SOLN: 50.0000 ug | INTRAMUSCULAR | Status: DC | PRN

## 2013-11-12 MED ORDER — DEXAMETHASONE SODIUM PHOSPHATE 4 MG/ML IJ SOLN
INTRAMUSCULAR | Status: DC | PRN
  Administered 2013-11-12: 10 mg via INTRAVENOUS

## 2013-11-12 MED ORDER — MIDAZOLAM HCL 2 MG/2ML IJ SOLN: 1.0000 mg | INTRAMUSCULAR | Status: DC | PRN

## 2013-11-12 MED ORDER — PROMETHAZINE HCL 12.5 MG PO TABS: 12.5000 mg | ORAL_TABLET | Freq: Four times a day (QID) | ORAL | Status: DC | PRN

## 2013-11-12 MED ORDER — PROPOFOL 10 MG/ML IV BOLUS
INTRAVENOUS | Status: AC
  Filled 2013-11-12: qty 20

## 2013-11-12 MED ORDER — ONDANSETRON HCL 4 MG/2ML IJ SOLN: 4.0000 mg | Freq: Once | INTRAMUSCULAR | Status: DC | PRN

## 2013-11-12 MED ORDER — LIDOCAINE HCL (CARDIAC) 20 MG/ML IV SOLN
INTRAVENOUS | Status: DC | PRN
  Administered 2013-11-12: 80 mg via INTRAVENOUS

## 2013-11-12 SURGICAL SUPPLY — 42 items
ADH SKN CLS APL DERMABOND .7 (GAUZE/BANDAGES/DRESSINGS) ×2
APPLIER CLIP ROT 10 11.4 M/L (STAPLE) ×4
APR CLP MED LRG 11.4X10 (STAPLE) ×2
BAG SPEC RTRVL LRG 6X4 10 (ENDOMECHANICALS) ×2
BLADE SURG ROTATE 9660 (MISCELLANEOUS) IMPLANT
CANISTER SUCT 1200ML W/VALVE (MISCELLANEOUS) ×4 IMPLANT
CHLORAPREP W/TINT 26ML (MISCELLANEOUS) ×4 IMPLANT
CLIP APPLIE ROT 10 11.4 M/L (STAPLE) ×2 IMPLANT
COVER MAYO STAND STRL (DRAPES) ×4 IMPLANT
DECANTER SPIKE VIAL GLASS SM (MISCELLANEOUS) IMPLANT
DERMABOND ADVANCED (GAUZE/BANDAGES/DRESSINGS) ×2
DERMABOND ADVANCED .7 DNX12 (GAUZE/BANDAGES/DRESSINGS) ×2 IMPLANT
DRAPE C-ARM 42X72 X-RAY (DRAPES) ×1 IMPLANT
DRAPE UTILITY XL STRL (DRAPES) ×4 IMPLANT
ELECT REM PT RETURN 9FT ADLT (ELECTROSURGICAL) ×4
ELECTRODE REM PT RTRN 9FT ADLT (ELECTROSURGICAL) ×2 IMPLANT
FILTER SMOKE EVAC LAPAROSHD (FILTER) ×1 IMPLANT
GLOVE BIO SURGEON STRL SZ8 (GLOVE) ×4 IMPLANT
GLOVE BIOGEL PI IND STRL 8 (GLOVE) ×2 IMPLANT
GLOVE BIOGEL PI INDICATOR 8 (GLOVE) ×2
GOWN STRL REUS W/ TWL LRG LVL3 (GOWN DISPOSABLE) ×5 IMPLANT
GOWN STRL REUS W/TWL LRG LVL3 (GOWN DISPOSABLE) ×12
HEMOSTAT SNOW SURGICEL 2X4 (HEMOSTASIS) ×4 IMPLANT
LINER CANISTER 1000CC FLEX (MISCELLANEOUS) ×4 IMPLANT
NS IRRIG 1000ML POUR BTL (IV SOLUTION) IMPLANT
PACK BASIN DAY SURGERY FS (CUSTOM PROCEDURE TRAY) ×4 IMPLANT
POUCH SPECIMEN RETRIEVAL 10MM (ENDOMECHANICALS) ×4 IMPLANT
SCISSORS LAP 5X35 DISP (ENDOMECHANICALS) IMPLANT
SET CHOLANGIOGRAPH 5 50 .035 (SET/KITS/TRAYS/PACK) ×1 IMPLANT
SET IRRIG TUBING LAPAROSCOPIC (IRRIGATION / IRRIGATOR) ×4 IMPLANT
SLEEVE ENDOPATH XCEL 5M (ENDOMECHANICALS) ×8 IMPLANT
SLEEVE SCD COMPRESS KNEE MED (MISCELLANEOUS) ×4 IMPLANT
SUT MNCRL AB 4-0 PS2 18 (SUTURE) ×4 IMPLANT
SUT VICRYL 0 UR6 27IN ABS (SUTURE) IMPLANT
TOWEL OR 17X24 6PK STRL BLUE (TOWEL DISPOSABLE) ×4 IMPLANT
TOWEL OR NON WOVEN STRL DISP B (DISPOSABLE) ×4 IMPLANT
TRAY LAPAROSCOPIC (CUSTOM PROCEDURE TRAY) ×4 IMPLANT
TROCAR XCEL BLUNT TIP 100MML (ENDOMECHANICALS) ×4 IMPLANT
TROCAR XCEL NON-BLD 11X100MML (ENDOMECHANICALS) ×4 IMPLANT
TROCAR XCEL NON-BLD 5MMX100MML (ENDOMECHANICALS) ×4 IMPLANT
TUBE CONNECTING 20'X1/4 (TUBING) ×1
TUBE CONNECTING 20X1/4 (TUBING) ×3 IMPLANT

## 2013-11-12 NOTE — Anesthesia Postprocedure Evaluation (Signed)
  Anesthesia Post-op Note  Patient: Madison Greenspan, MD  Procedure(s) Performed: Procedure(s): LAPAROSCOPIC CHOLECYSTECTOMY (N/A)  Patient Location: PACU  Anesthesia Type:General  Level of Consciousness: awake, alert  and oriented  Airway and Oxygen Therapy: Patient Spontanous Breathing and Patient connected to face mask oxygen  Post-op Pain: mild  Post-op Assessment: Post-op Vital signs reviewed  Post-op Vital Signs: Reviewed  Last Vitals:  Filed Vitals:   11/12/13 1630  BP: 116/76  Pulse: 91  Temp:   Resp: 15    Complications: No apparent anesthesia complications

## 2013-11-12 NOTE — Anesthesia Procedure Notes (Signed)
Procedure Name: Intubation Date/Time: 11/12/2013 1:50 PM Performed by: Maryella Shivers Pre-anesthesia Checklist: Patient identified, Emergency Drugs available, Suction available and Patient being monitored Patient Re-evaluated:Patient Re-evaluated prior to inductionOxygen Delivery Method: Circle System Utilized Preoxygenation: Pre-oxygenation with 100% oxygen Intubation Type: IV induction Ventilation: Mask ventilation without difficulty Laryngoscope Size: Mac and 3 Grade View: Grade I Tube type: Oral Tube size: 7.0 mm Number of attempts: 1 Airway Equipment and Method: stylet and oral airway Placement Confirmation: ETT inserted through vocal cords under direct vision,  positive ETCO2 and breath sounds checked- equal and bilateral Secured at: 20 cm Tube secured with: Tape Dental Injury: Teeth and Oropharynx as per pre-operative assessment

## 2013-11-12 NOTE — Anesthesia Preprocedure Evaluation (Signed)
Anesthesia Evaluation  Patient identified by MRN, date of birth, ID band Patient awake    Reviewed: Allergy & Precautions, H&P , NPO status , Patient's Chart, lab work & pertinent test results  Airway Mallampati: I TM Distance: >3 FB Neck ROM: Full    Dental  (+) Teeth Intact, Dental Advisory Given   Pulmonary  breath sounds clear to auscultation        Cardiovascular Rhythm:Regular Rate:Normal     Neuro/Psych    GI/Hepatic GERD-  Medicated and Controlled,  Endo/Other    Renal/GU      Musculoskeletal   Abdominal   Peds  Hematology   Anesthesia Other Findings   Reproductive/Obstetrics                           Anesthesia Physical Anesthesia Plan  ASA: I  Anesthesia Plan: General   Post-op Pain Management:    Induction: Intravenous  Airway Management Planned: LMA  Additional Equipment:   Intra-op Plan:   Post-operative Plan: Extubation in OR  Informed Consent: I have reviewed the patients History and Physical, chart, labs and discussed the procedure including the risks, benefits and alternatives for the proposed anesthesia with the patient or authorized representative who has indicated his/her understanding and acceptance.   Dental advisory given  Plan Discussed with: CRNA, Anesthesiologist and Surgeon  Anesthesia Plan Comments:         Anesthesia Quick Evaluation

## 2013-11-12 NOTE — Transfer of Care (Signed)
Immediate Anesthesia Transfer of Care Note  Patient: Madison Greenspan, MD  Procedure(s) Performed: Procedure(s): LAPAROSCOPIC CHOLECYSTECTOMY (N/A)  Patient Location: PACU  Anesthesia Type:General  Level of Consciousness: awake, alert  and oriented  Airway & Oxygen Therapy: Patient Spontanous Breathing and Patient connected to face mask oxygen  Post-op Assessment: Report given to PACU RN and Post -op Vital signs reviewed and stable  Post vital signs: Reviewed and stable  Complications: No apparent anesthesia complications

## 2013-11-12 NOTE — Op Note (Signed)
Laparoscopic Cholecystectomy  Indications: This patient presents with symptomatic gallbladder disease and will undergo laparoscopic cholecystectomy.The procedure has been discussed with the patient. Operative and non operative treatments have been discussed. Risks of surgery include bleeding, infection,  Common bile duct injury,  Injury to the stomach,liver, colon,small intestine, abdominal wall,  Diaphragm,  Major blood vessels,  And the need for an open procedure.  Other risks include worsening of medical problems, death,  DVT and pulmonary embolism, and cardiovascular events.   Medical options have also been discussed. The patient has been informed of long term expectations of surgery and non surgical options,  The patient agrees to proceed.    Pre-operative Diagnosis: Calculus of gallbladder without mention of cholecystitis or obstruction  Post-operative Diagnosis: Same  Surgeon: CORNETT,THOMAS A.   Assistants: OR staff  Anesthesia: General endotracheal anesthesia and Local anesthesia 0.25.% bupivacaine, with epinephrine  ASA Class: 2  Procedure Details  The patient was seen again in the Holding Room. The risks, benefits, complications, treatment options, and expected outcomes were discussed with the patient. The possibilities of reaction to medication, pulmonary aspiration, perforation of viscus, bleeding, recurrent infection, finding a normal gallbladder, the need for additional procedures, failure to diagnose a condition, the possible need to convert to an open procedure, and creating a complication requiring transfusion or operation were discussed with the patient. The patient and/or family concurred with the proposed plan, giving informed consent. The site of surgery properly noted/marked. The patient was taken to Operating Room, identified as Madison Greenspan, MD and the procedure verified as Laparoscopic Cholecystectomy with Intraoperative Cholangiograms. A Time Out was held and the above  information confirmed.  Prior to the induction of general anesthesia, antibiotic prophylaxis was administered. General endotracheal anesthesia was then administered and tolerated well. After the induction, the abdomen was prepped in the usual sterile fashion. The patient was positioned in the supine position with the left arm comfortably tucked, along with some reverse Trendelenburg.  Local anesthetic agent was injected into the skin near the umbilicus and an incision made. The midline fascia was incised and the Hasson technique was used to introduce a 12 mm port under direct vision. It was secured with a figure of eight Vicryl suture placed in the usual fashion. Pneumoperitoneum was then created with CO2 and tolerated well without any adverse changes in the patient's vital signs. Additional trocars were introduced under direct vision with an 11 mm trocar in the epigastrium and 2 5 mm trocars in the right upper quadrant. All skin incisions were infiltrated with a local anesthetic agent before making the incision and placing the trocars.   The gallbladder was identified, the fundus grasped and retracted cephalad. Adhesions were lysed bluntly and with the electrocautery where indicated, taking care not to injure any adjacent organs or viscus. The infundibulum was grasped and retracted laterally, exposing the peritoneum overlying the triangle of Calot. This was then divided and exposed in a blunt fashion. The cystic duct was clearly identified and bluntly dissected circumferentially. The junctions of the gallbladder, cystic duct and common bile duct were clearly identified prior to the division of any linear structure.   Pt has a history of iodine allergy.  Pt has normal LFTs  and I could see the common bile duct and junction of the cystic duct and common bile duct.  No dilation noted of common bile duct. .  I elected to forgo cholangiogram.   The cystic duct was then  ligated with surgical clips  on the  patient side and  clipped on the gallbladder side and divided. The cystic artery was identified, dissected free, ligated with clips and divided as well. Posterior cystic artery clipped and divided.  The gallbladder was dissected from the liver bed in retrograde fashion with the electrocautery.  The gallbladder was partially intrahepatic. The gallbladder was removed and placed into an Endocatch bag. . The liver bed was irrigated and inspected. Hemostasis was achieved with the electrocautery and Sugicel snow. Copious irrigation was utilized and was repeatedly aspirated until clear all particulate matter. Hemostasis was achieved with no signs  Of bleeding or bile leakage. The gallbladder then extracted from the umbilical port site and passed of the field.  Port site fascia at umbilicus closed with 0 vicryl.   Pneumoperitoneum was completely reduced after viewing removal of the trocars under direct vision. The wound was thoroughly irrigated and the fascia was then closed with a figure of eight suture; the skin was then closed with 4 O monocryl  and a sterile Dermabond  dressing was applied.  Instrument, sponge, and needle counts were correct at closure and at the conclusion of the case.   Findings:  Cholelithiasis  Estimated Blood Loss: Minimal         Drains: none         Total IV Fluids: 600 mL         Specimens: Gallbladder           Complications: None; patient tolerated the procedure well.         Disposition: PACU - hemodynamically stable.         Condition: stable

## 2013-11-12 NOTE — Interval H&P Note (Signed)
History and Physical Interval Note:  11/12/2013 1:18 PM  Abner Greenspan, MD  has presented today for surgery, with the diagnosis of gallstones  The various methods of treatment have been discussed with the patient and family. After consideration of risks, benefits and other options for treatment, the patient has consented to  Procedure(s): LAPAROSCOPIC CHOLECYSTECTOMY WITH INTRAOPERATIVE CHOLANGIOGRAM (N/A) as a surgical intervention .  The patient's history has been reviewed, patient examined, no change in status, stable for surgery.  I have reviewed the patient's chart and labs.  Questions were answered to the patient's satisfaction.     CORNETT,THOMAS A.

## 2013-11-12 NOTE — Discharge Instructions (Signed)
[facility] ______[facility] °LAPAROSCOPIC SURGERY: POST OP INSTRUCTIONS °Always review your discharge instruction sheet given to you by the facility where your surgery was performed. °IF YOU HAVE DISABILITY OR FAMILY LEAVE FORMS, YOU MUST BRING THEM TO THE OFFICE FOR PROCESSING.   °DO NOT GIVE THEM TO YOUR DOCTOR. ° °1. A prescription for pain medication may be given to you upon discharge.  Take your pain medication as prescribed, if needed.  If narcotic pain medicine is not needed, then you may take acetaminophen (Tylenol) or ibuprofen (Advil) as needed. °2. Take your usually prescribed medications unless otherwise directed. °3. If you need a refill on your pain medication, please contact your pharmacy.  They will contact our office to request authorization. Prescriptions will not be filled after 5pm or on week-ends. °4. You should follow a light diet the first few days after arrival home, such as soup and crackers, etc.  Be sure to include lots of fluids daily. °5. Most patients will experience some swelling and bruising in the area of the incisions.  Ice packs will help.  Swelling and bruising can take several days to resolve.  °6. It is common to experience some constipation if taking pain medication after surgery.  Increasing fluid intake and taking a stool softener (such as Colace) will usually help or prevent this problem from occurring.  A mild laxative (Milk of Magnesia or Miralax) should be taken according to package instructions if there are no bowel movements after 48 hours. °7. Unless discharge instructions indicate otherwise, you may remove your bandages 24-48 hours after surgery, and you may shower at that time.  You may have steri-strips (small skin tapes) in place directly over the incision.  These strips should be left on the skin for 7-10 days.  If your surgeon used skin glue on the incision, you may shower in 24 hours.  The glue will flake off over the next 2-3 weeks.  Any sutures or  staples will be removed at the office during your follow-up visit. °8. ACTIVITIES:  You may resume regular (light) daily activities beginning the next day--such as daily self-care, walking, climbing stairs--gradually increasing activities as tolerated.  You may have sexual intercourse when it is comfortable.  Refrain from any heavy lifting or straining until approved by your doctor. °a. You may drive when you are no longer taking prescription pain medication, you can comfortably wear a seatbelt, and you can safely maneuver your car and apply brakes. °b. RETURN TO WORK:  __________________________________________________________ °9. You should see your doctor in the office for a follow-up appointment approximately 2-3 weeks after your surgery.  Make sure that you call for this appointment within a day or two after you arrive home to insure a convenient appointment time. °10. OTHER INSTRUCTIONS: __________________________________________________________________________________________________________________________ __________________________________________________________________________________________________________________________ °WHEN TO CALL YOUR DOCTOR: °1. Fever over 101.0 °2. Inability to urinate °3. Continued bleeding from incision. °4. Increased pain, redness, or drainage from the incision. °5. Increasing abdominal pain ° °The clinic staff is available to answer your questions during regular business hours.  Please don’t hesitate to call and ask to speak to one of the nurses for clinical concerns.  If you have a medical emergency, go to the nearest emergency room or call 911.  A surgeon from [facility] is always on call at the hospital. °[address], [address], [place], [place]  [address] ? [address] [address], [place],    [address] °[phone] ? [phone] ? FAX [phone] °Web site:   [email or web address] ° ° °Post Anesthesia Home Care Instructions ° °Activity: °Get  plenty of rest for the remainder of the day. A responsible adult should stay with you for 24 hours following the procedure.  °For the next 24 hours, DO NOT: °-Drive a car °-Operate machinery °-Drink alcoholic beverages °-Take any medication unless instructed by your physician °-Make any legal decisions or sign important papers. ° °Meals: °Start with liquid foods such as gelatin or soup. Progress to regular foods as tolerated. Avoid greasy, spicy, heavy foods. If nausea and/or vomiting occur, drink only clear liquids until the nausea and/or vomiting subsides. Call your physician if vomiting continues. ° °Special Instructions/Symptoms: °Your throat may feel dry or sore from the anesthesia or the breathing tube placed in your throat during surgery. If this causes discomfort, gargle with warm salt water. The discomfort should disappear within 24 hours. ° °

## 2013-11-12 NOTE — H&P (View-Only) (Signed)
Patient ID: Madison Greenspan, MD, female   DOB: 12-17-1965, 48 y.o.   MRN: [id number]  No chief complaint on file.   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  Pt sent at the request of Dr Eulah Pont for abdominal pain and gallbladder disease.  Pt has had intermittent symptoms of RUQ with fatty foods. On and off for months.  Radiation to right shoulder.  HPI  Past Medical History  Diagnosis Date  . MENST MIGRAINE W/INTRACT W/O STATUS MIGRAINOSUS 07/16/2007    Qualifier: Diagnosis of  By: Council Mechanic MD, Hilaria Ota   . GERD (gastroesophageal reflux disease)     Past Surgical History  Procedure Laterality Date  . Breast surgery      reduction  . Urethral dilation    . Tonsillectomy      Family History  Problem Relation Age of Onset  . Breast cancer Paternal Aunt     Social History History  Substance Use Topics  . Smoking status: Never Smoker   . Smokeless tobacco: Not on file  . Alcohol Use: Not on file    Allergies  Allergen Reactions  . Iodine   . Penicillins     Current Outpatient Prescriptions  Medication Sig Dispense Refill  . cetirizine (ZYRTEC) 10 MG tablet Take 10 mg by mouth daily.      . clonazePAM (KLONOPIN) 0.5 MG tablet Take 0.5 mg by mouth 2 (two) times daily as needed for anxiety.      Marland Kitchen dexlansoprazole (DEXILANT) 60 MG capsule Take 60 mg by mouth daily.      . Eletriptan Hydrobromide (RELPAX PO) Take by mouth as needed.      . montelukast (SINGULAIR) 10 MG tablet Take 10 mg by mouth at bedtime.      . naproxen sodium (ANAPROX) 220 MG tablet Take 220 mg by mouth as needed.      Danelle Earthly Estradiol (BALZIVA PO) Take by mouth.      Marland Kitchen azelastine (ASTELIN) 137 MCG/SPRAY nasal spray Place 2 sprays into the nose 2 (two) times daily. Use in each nostril as directed  30 mL  3   No current facility-administered medications for this visit.    Review of Systems Review of Systems  Respiratory: Negative.   Cardiovascular: Negative.   Gastrointestinal:  Positive for abdominal pain.  Musculoskeletal: Positive for gait problem.  Psychiatric/Behavioral: Negative.     Blood pressure 112/70, pulse 74, height 5\' 3"  (1.6 m), weight 148 lb 9.6 oz (67.405 kg).  Physical Exam Physical Exam  Constitutional: She is oriented to person, place, and time. She appears well-developed and well-nourished.  Eyes: No scleral icterus.  Cardiovascular: Normal rate and regular rhythm.   Pulmonary/Chest: Effort normal and breath sounds normal.  Abdominal: Soft. Bowel sounds are normal. She exhibits no distension. There is no tenderness.  Neurological: She is alert and oriented to person, place, and time.  Skin: Skin is warm and dry.  Psychiatric: She has a normal mood and affect. Her behavior is normal. Judgment and thought content normal.    Data Reviewed Gallstones no CBD dilation no wall thickening.   Assessment    Symptomatic cholelithiasis    Plan    Recommend laparoscopic cholecystectomy and cholangiogram.  The procedure has been discussed with the patient. Operative and non operative treatments have been discussed. Risks of surgery include bleeding, infection,  Common bile duct injury,  Injury to the stomach,liver, colon,small intestine, abdominal wall,  Diaphragm,  Major blood vessels,  And  the need for an open procedure.  Other risks include worsening of medical problems, death,  DVT and pulmonary embolism, and cardiovascular events.   Medical options have also been discussed. The patient has been informed of long term expectations of surgery and non surgical options,  The patient agrees to proceed.         Analys Ryden A. Hendrix Yurkovich 10/22/2013, 8:53 AM

## 2013-11-13 ENCOUNTER — Encounter (HOSPITAL_BASED_OUTPATIENT_CLINIC_OR_DEPARTMENT_OTHER): Payer: Self-pay | Admitting: Surgery

## 2013-11-24 ENCOUNTER — Ambulatory Visit (INDEPENDENT_AMBULATORY_CARE_PROVIDER_SITE_OTHER): Payer: Commercial Managed Care - PPO | Admitting: Surgery

## 2013-11-24 ENCOUNTER — Encounter (INDEPENDENT_AMBULATORY_CARE_PROVIDER_SITE_OTHER): Payer: Self-pay | Admitting: Surgery

## 2013-11-24 VITALS — BP 112/76 | HR 76 | Resp 14 | Ht 63.0 in | Wt 148.0 lb

## 2013-11-24 DIAGNOSIS — Z9889 Other specified postprocedural states: Secondary | ICD-10-CM | POA: Insufficient documentation

## 2013-11-24 NOTE — Progress Notes (Signed)
she is here for a postop visit following laparoscopic cholecystectomy.   No problems with incisions. Some gas pain post op.   PE:  ABD:  Soft, incisions clean/dry/intact and solid.  Assessment:  Doing well postop.  Plan:Marland Kitchen  Activities as tolerated.  Return visit prn.

## 2013-11-24 NOTE — Patient Instructions (Signed)
Return as needed

## 2014-06-18 ENCOUNTER — Telehealth: Payer: Self-pay | Admitting: Family Medicine

## 2014-06-18 MED ORDER — RIZATRIPTAN BENZOATE 10 MG PO TABS: 10.0000 mg | ORAL_TABLET | ORAL | Status: DC | PRN

## 2014-06-18 MED ORDER — GABAPENTIN 600 MG PO TABS: 600.0000 mg | ORAL_TABLET | Freq: Three times a day (TID) | ORAL | Status: DC

## 2014-06-18 NOTE — Telephone Encounter (Signed)
Dr T requests refill of gabapentin and maxalt. Has been unable to get through to her [facility] doctor's Sima Matas) office for refill and with upcoming inclement weather I will refill meds.

## 2014-08-06 ENCOUNTER — Other Ambulatory Visit: Payer: Self-pay | Admitting: Obstetrics and Gynecology

## 2014-08-07 LAB — CYTOLOGY - PAP

## 2014-12-23 ENCOUNTER — Other Ambulatory Visit: Payer: Self-pay | Admitting: Internal Medicine

## 2014-12-23 MED ORDER — RIZATRIPTAN BENZOATE 10 MG PO TABS: 10.0000 mg | ORAL_TABLET | ORAL | Status: DC | PRN

## 2015-02-02 ENCOUNTER — Other Ambulatory Visit: Payer: Self-pay | Admitting: Family Medicine

## 2015-02-02 MED ORDER — RIZATRIPTAN BENZOATE 10 MG PO TABS: 10.0000 mg | ORAL_TABLET | ORAL | Status: DC | PRN

## 2015-02-02 NOTE — Progress Notes (Signed)
Pt needed single dose maxalt sent.   rx sent.

## 2015-06-03 MED FILL — DEXILANT DR 60 MG CAPSULE: 60 | 90 days supply | Qty: 90 | Fill #3

## 2015-06-10 MED FILL — RIZATRIPTAN 10 MG TABLET: 10 | 30 days supply | Qty: 9 | Fill #2

## 2015-06-24 MED FILL — MONTELUKAST SOD 10 MG TAB: 10 | 90 days supply | Qty: 90 | Fill #3

## 2015-07-22 MED FILL — predniSONE 10 MG TABS: 10 | 30 days supply | Qty: 60 | Fill #0

## 2015-08-17 MED FILL — PHILITH 0.4-0.035 MG TABLET: 0.4-35 | 84 days supply | Qty: 112 | Fill #0

## 2015-08-30 MED FILL — DEXILANT DR 60 MG CAPSULE: 60 | 90 days supply | Qty: 90 | Fill #0

## 2015-08-31 MED FILL — clonazePAM 0.5 MG TABS: 0.5 | 90 days supply | Qty: 180 | Fill #0

## 2015-09-16 DIAGNOSIS — G43009 Migraine without aura, not intractable, without status migrainosus: Secondary | ICD-10-CM | POA: Diagnosis not present

## 2015-09-16 MED FILL — RIZATRIPTAN 10 MG TABLET: 10 | 90 days supply | Qty: 27 | Fill #0

## 2015-09-16 MED FILL — GABAPENTIN 600 MG TABLET: 600 | 90 days supply | Qty: 270 | Fill #0

## 2015-09-23 MED FILL — MONTELUKAST SOD 10 MG TAB: 10 | 90 days supply | Qty: 90 | Fill #0

## 2015-10-14 DIAGNOSIS — Z6826 Body mass index (BMI) 26.0-26.9, adult: Secondary | ICD-10-CM | POA: Diagnosis not present

## 2015-10-14 DIAGNOSIS — Z1231 Encounter for screening mammogram for malignant neoplasm of breast: Secondary | ICD-10-CM | POA: Diagnosis not present

## 2015-10-14 DIAGNOSIS — Z01419 Encounter for gynecological examination (general) (routine) without abnormal findings: Secondary | ICD-10-CM | POA: Diagnosis not present

## 2015-11-09 MED FILL — PHILITH 0.4-0.035 MG TABLET: 0.4-35 | 84 days supply | Qty: 112 | Fill #1

## 2015-11-29 MED FILL — DEXILANT DR 60 MG CAPSULE: 60 | 90 days supply | Qty: 90 | Fill #0

## 2015-12-24 MED FILL — MONTELUKAST SOD 10 MG TAB: 10 | 90 days supply | Qty: 90 | Fill #0

## 2016-01-14 MED FILL — LINZESS 145 MCG CAPSULE: 145 | 90 days supply | Qty: 90 | Fill #0

## 2016-01-26 MED FILL — PHILITH 0.4-0.035 MG TABLET: 0.4-35 | 84 days supply | Qty: 112 | Fill #2

## 2016-01-27 MED FILL — clonazePAM 0.5 MG TABS: 0.5 | 90 days supply | Qty: 180 | Fill #0

## 2016-01-31 IMAGING — US US ABDOMEN LIMITED
1 series · 14 of 25 positions shown · non-contrast
Comparison: None.

CLINICAL DATA: Right upper quadrant abdominal pain radiating to the
right shoulder

EXAM:
US ABDOMEN LIMITED - RIGHT UPPER QUADRANT

[Series 1: us abdomen limited · 0.26mm/px · 14 of 60 slices shown]
[im 1/60]
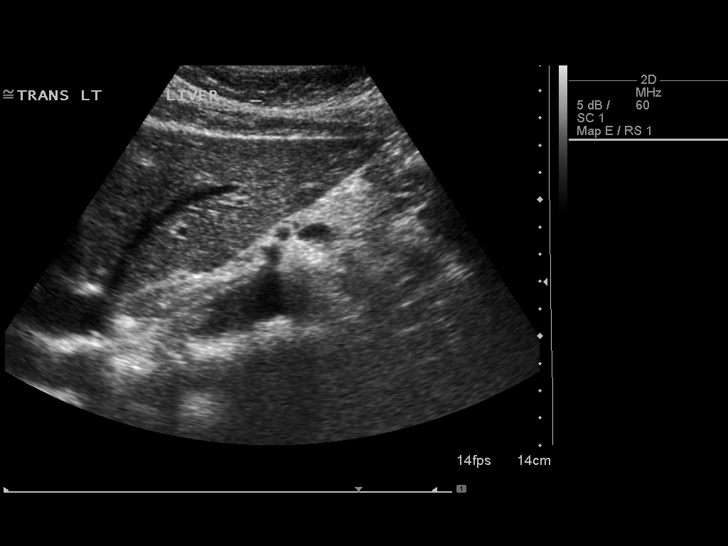
[im 5/60]
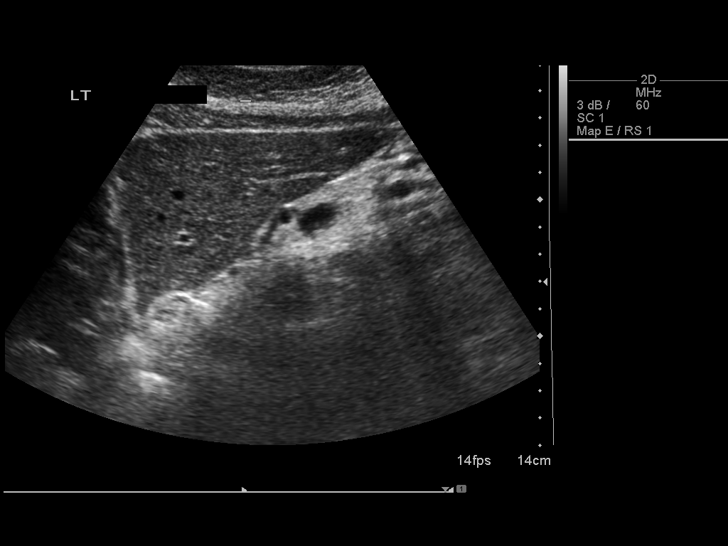
[im 10/60]
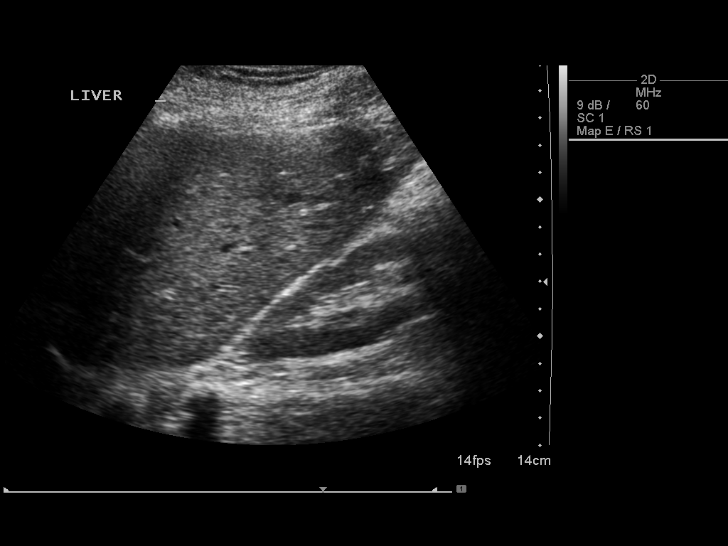
[im 15/60]
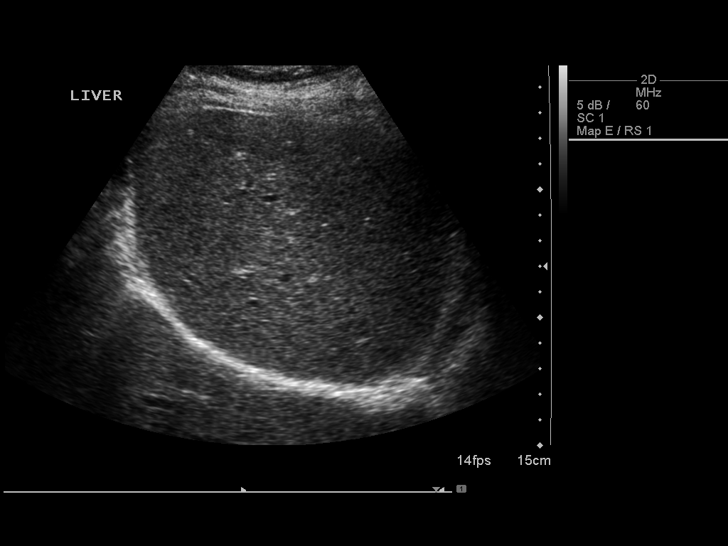
[im 20/60]
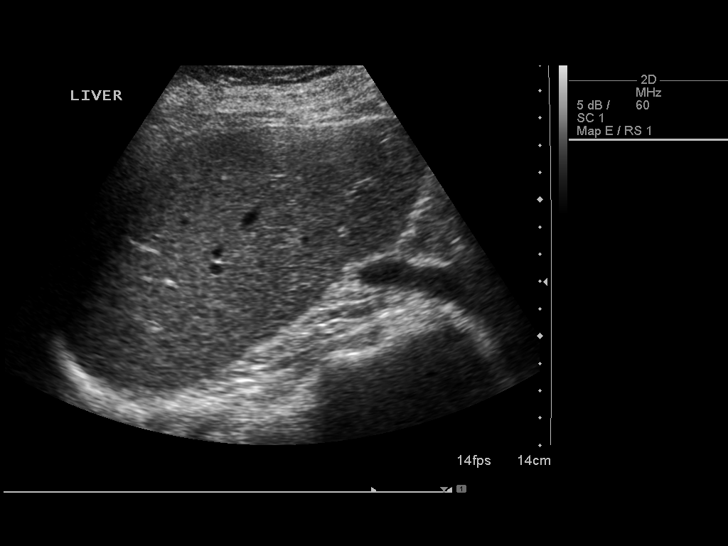
[im 23/60]
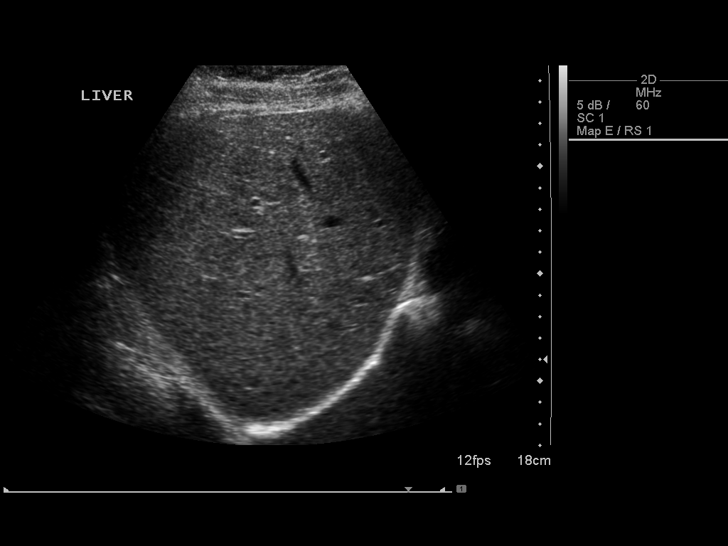
[im 28/60]
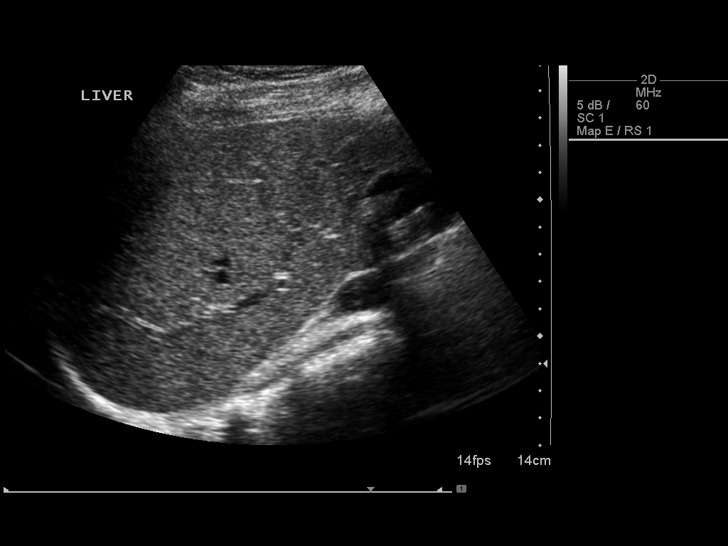
[im 32/60]
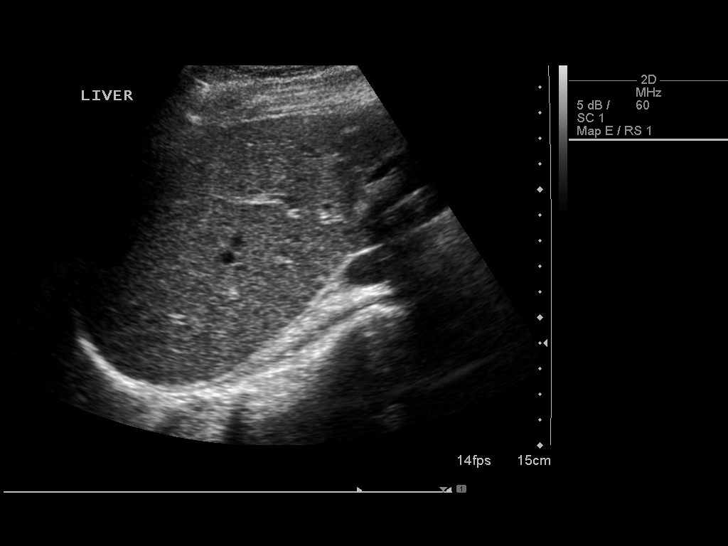
[im 37/60]
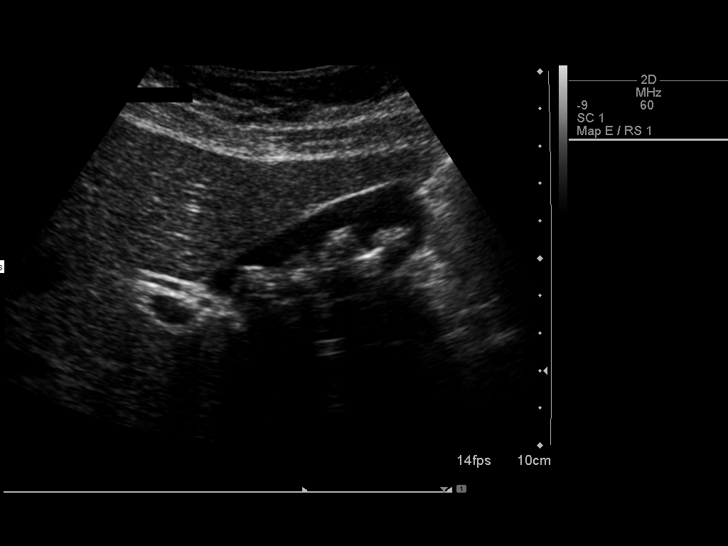
[im 40/60]
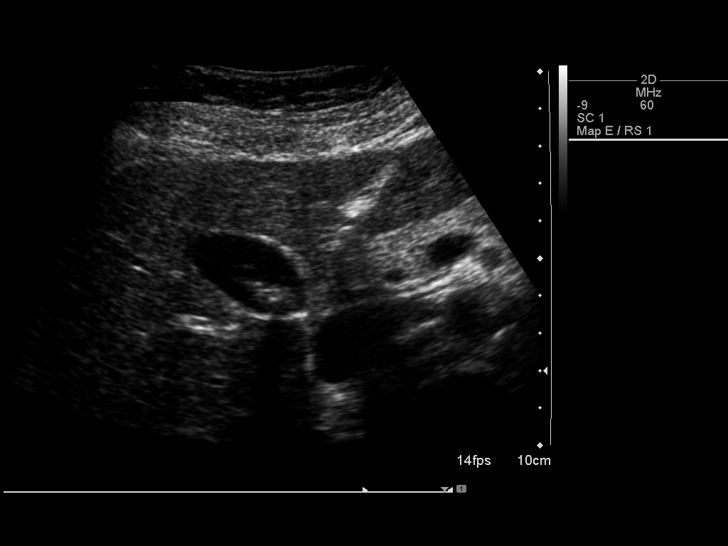
[im 45/60]
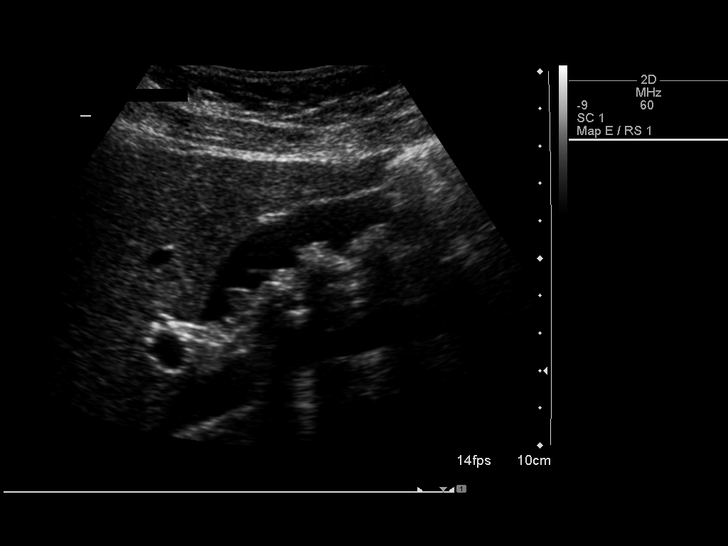
[im 50/60]
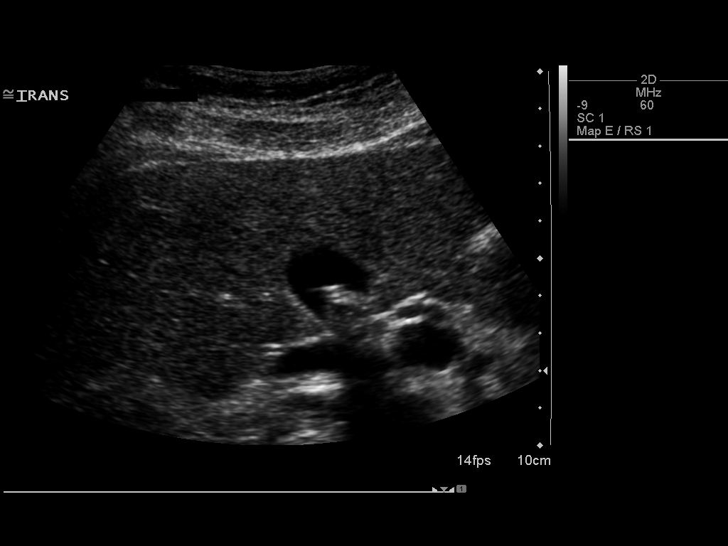
[im 55/60]
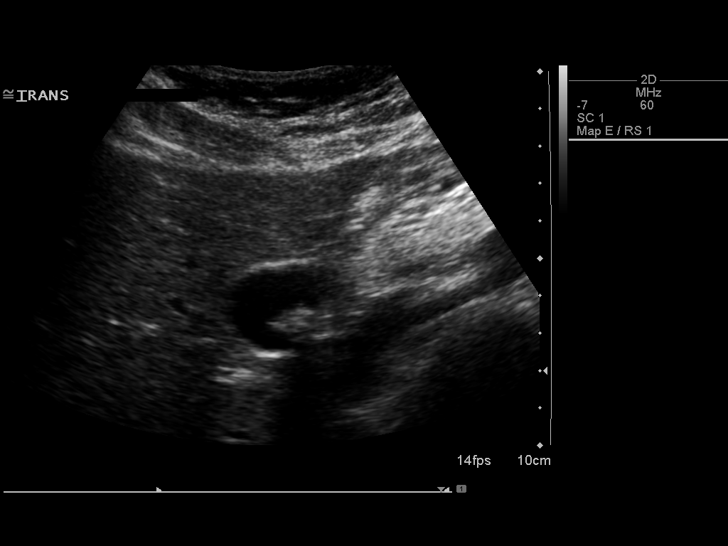
[im 60/60]
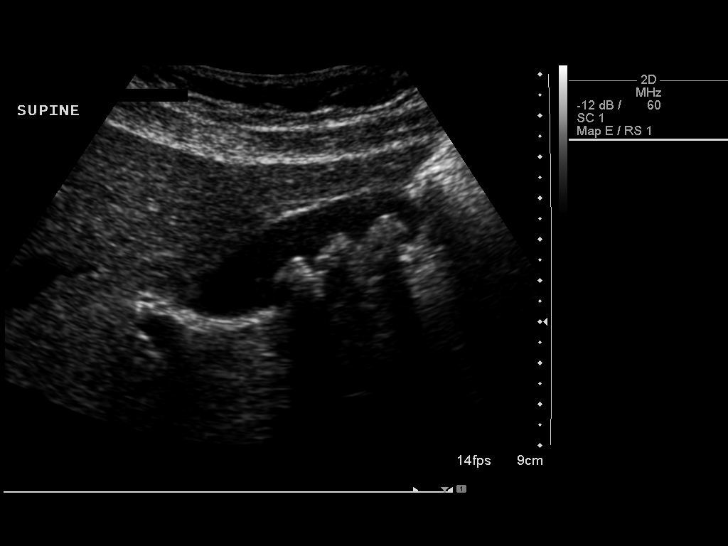

[14 of 25 positions shown; findings below may reference images not displayed]

FINDINGS: Gallbladder:

There are multiple gallstones within the gallbladder, the largest
measuring 1.4 cm with acoustical shadowing. There is no pain over
the gallbladder currently with compression and the gallbladder wall
is not thickened.

Common bile duct:

Diameter: The common bile duct is normal measuring 2.4 mm in
diameter.

Liver:

The liver has a normal echogenic pattern. No focal abnormality is
seen.
IMPRESSION: 1. Multiple gallstones. No ultrasound evidence of acute
cholecystitis.
2. No abnormality of the liver is seen.

## 2016-03-09 ENCOUNTER — Encounter: Payer: Self-pay | Admitting: Sports Medicine

## 2016-03-09 ENCOUNTER — Ambulatory Visit (INDEPENDENT_AMBULATORY_CARE_PROVIDER_SITE_OTHER): Payer: 59 | Admitting: Sports Medicine

## 2016-03-09 VITALS — BP 119/58 | Ht 63.0 in | Wt 151.0 lb

## 2016-03-09 DIAGNOSIS — M25562 Pain in left knee: Secondary | ICD-10-CM

## 2016-03-09 DIAGNOSIS — R269 Unspecified abnormalities of gait and mobility: Secondary | ICD-10-CM | POA: Diagnosis not present

## 2016-03-09 DIAGNOSIS — M25561 Pain in right knee: Secondary | ICD-10-CM | POA: Diagnosis not present

## 2016-03-09 NOTE — Progress Notes (Signed)
  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  CC: bilateral feet and knee pain Complains of pain in her bilateral feet. 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 She still does some dance classes and is getting significant amount of pain especially at her MTP joints. She has worn custom orthotics in the past helped and she would like to have another pair today as her other ones have worn out especially at her MTP joints. She denies any numbness or tingling. She also complains of bilateral knee pain but right greater than left. She does not report swelling but states that his anterior knee pain. She cannot recall specific injury to the knee. Denies any numbness or tingling distally.   ROS: No unexpected weight loss, fever, chills, swelling, instability, muscle pain, numbness/tingling, redness, otherwise see HPI   PMHx - Updated and reviewed.  Contributory factors include: History of a calf tear PSHx - Updated and reviewed.  Contributory factors include:  Negative FHx - Updated and reviewed.  Contributory factors include:  Negative Social Hx - Updated and reviewed. 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 Medications - reviewed   DATA REVIEWED: Previous office visits  PHYSICAL EXAM:  VS: BP:(!) 119/58  HR: bpm  TEMP: ( )  RESP:   HT:5\' 3"  (160 cm)   WT:151 lb (68.5 kg)  BMI:26.8 PHYSICAL EXAM: Gen: NAD, alert, cooperative with exam, well-appearing HEENT: clear conjunctiva,  CV:  no edema, capillary refill brisk, normal rate Resp: non-labored Skin: no rashes, normal turgor  Neuro: no gross deficits.  Psych:  alert and oriented  Knee: Normal to inspection with no erythema or effusion or obvious bony abnormalities. Palpation normal with no warmth, joint line tenderness, patellar tenderness, or condyle  tenderness. ROM full in flexion and extension and lower leg rotation. Ligaments with solid consistent endpoints including ACL, PCL, LCL, MCL. Negative Mcmurray's, Apley's, and Thessalonian tests. Non painful patellar compression. Patellar glide with minimal crepitus. Patellar and quadriceps tendons unremarkable, but slight weakness of her VMO bilaterally. Hamstring and quadriceps strength is normal.  Positive tethering on the right of her patella.   ASSESSMENT & PLAN:   ABNORMALITY OF GAIT Patient fitted for orthotics. She'll follow-up as needed. See note below.  Pain in both knees Likely patellofemoral syndrome. Gave VMO at exercises and will follow-up as needed.    Patient was fitted for a : standard, cushioned, semi-rigid orthotic for bilateral foot pain.  She has pain intermittently in her feet from long standing dancing The orthotic was heated and afterward the patient stood on the orthotic blank positioned on the orthotic stand. The patient was positioned in subtalar neutral position and 10 degrees of ankle dorsiflexion in a weight bearing stance. After completion of molding, a stable base was applied to the orthotic blank. The blank was ground to a stable position for weight bearing. Size: 6 Base: blue Posting: none Additional orthotic padding: none  Patient was counseled reviewing diagnosis and treatment in detail, totaling in 50 minutes, over half of which was spent in face to face counseling.

## 2016-03-10 ENCOUNTER — Ambulatory Visit
Admission: RE | Admit: 2016-03-10 | Discharge: 2016-03-10 | Disposition: A | Discharge status: Home / Self Care | Payer: 59 | Source: Ambulatory Visit | Attending: Sports Medicine | Admitting: Sports Medicine

## 2016-03-10 ENCOUNTER — Ambulatory Visit (INDEPENDENT_AMBULATORY_CARE_PROVIDER_SITE_OTHER)
Admission: RE | Admit: 2016-03-10 | Discharge: 2016-03-10 | Disposition: A | Discharge status: Home / Self Care | Payer: 59 | Source: Ambulatory Visit | Attending: Sports Medicine | Admitting: Sports Medicine

## 2016-03-10 DIAGNOSIS — M25562 Pain in left knee: Principal | ICD-10-CM

## 2016-03-10 DIAGNOSIS — M25561 Pain in right knee: Secondary | ICD-10-CM

## 2016-03-10 NOTE — Assessment & Plan Note (Signed)
Patient fitted for orthotics. She'll follow-up as needed. See note below.

## 2016-03-10 NOTE — Assessment & Plan Note (Signed)
Likely patellofemoral syndrome. Gave VMO at exercises and will follow-up as needed.

## 2016-03-13 ENCOUNTER — Telehealth: Payer: Self-pay | Admitting: Sports Medicine

## 2016-03-13 NOTE — Telephone Encounter (Signed)
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 There is a paucity of degenerative changes on her x-rays. She has a mild amount of medial compartmental narrowing on the flexion view but otherwise they are unremarkable. Specifically, the patellofemoral joint is in good shape. She is reassured of these findings. I think she is safe to continue with activity using pain as her guide. She tells me that her new orthotics are comfortable and she thinks the body helix compression sleeve is useful as well. She understands the importance of VMO and quad strengthening. Follow-up with me as needed.

## 2016-03-16 MED FILL — DEXILANT DR 60 MG CAPSULE: 60 | 90 days supply | Qty: 90 | Fill #1

## 2016-03-22 MED FILL — MONTELUKAST SOD 10 MG TAB: 10 | 90 days supply | Qty: 90 | Fill #0

## 2016-04-25 MED FILL — LINZESS 145 MCG CAPSULE: 145 | 90 days supply | Qty: 90 | Fill #1

## 2016-04-26 MED FILL — PHILITH 0.4-0.035 MG TABLET: 0.4-35 | 84 days supply | Qty: 112 | Fill #3

## 2016-06-08 MED FILL — DEXILANT DR 60 MG CAPSULE: 60 | 30 days supply | Qty: 30 | Fill #2

## 2016-06-12 DIAGNOSIS — M2241 Chondromalacia patellae, right knee: Secondary | ICD-10-CM | POA: Diagnosis not present

## 2016-06-22 DIAGNOSIS — M25562 Pain in left knee: Secondary | ICD-10-CM | POA: Diagnosis not present

## 2016-06-22 DIAGNOSIS — M25561 Pain in right knee: Secondary | ICD-10-CM | POA: Diagnosis not present

## 2016-06-22 MED FILL — MONTELUKAST SOD 10 MG TAB: 10 | 90 days supply | Qty: 90 | Fill #1

## 2016-06-29 DIAGNOSIS — M25562 Pain in left knee: Secondary | ICD-10-CM | POA: Diagnosis not present

## 2016-06-29 DIAGNOSIS — M25561 Pain in right knee: Secondary | ICD-10-CM | POA: Diagnosis not present

## 2016-07-06 DIAGNOSIS — M25561 Pain in right knee: Secondary | ICD-10-CM | POA: Diagnosis not present

## 2016-07-06 DIAGNOSIS — M25562 Pain in left knee: Secondary | ICD-10-CM | POA: Diagnosis not present

## 2016-07-06 MED FILL — clonazePAM 0.5 MG TABS: 0.5 | 90 days supply | Qty: 180 | Fill #1

## 2016-07-13 DIAGNOSIS — M25562 Pain in left knee: Secondary | ICD-10-CM | POA: Diagnosis not present

## 2016-07-13 DIAGNOSIS — M25561 Pain in right knee: Secondary | ICD-10-CM | POA: Diagnosis not present

## 2016-07-13 MED FILL — GABAPENTIN 600 MG TABLET: 600 | 90 days supply | Qty: 270 | Fill #1

## 2016-07-18 MED FILL — PHILITH 0.4-0.035 MG TABLET: 0.4-35 | 84 days supply | Qty: 112 | Fill #0

## 2016-09-14 MED FILL — MONTELUKAST SOD 10 MG TAB: 10 | 90 days supply | Qty: 90 | Fill #2

## 2016-10-03 MED FILL — PHILITH 0.4-0.035 MG TABLET: 0.4-35 | 84 days supply | Qty: 112 | Fill #1

## 2016-10-12 DIAGNOSIS — H5213 Myopia, bilateral: Secondary | ICD-10-CM | POA: Diagnosis not present

## 2016-10-26 DIAGNOSIS — Z01419 Encounter for gynecological examination (general) (routine) without abnormal findings: Secondary | ICD-10-CM | POA: Diagnosis not present

## 2016-10-26 DIAGNOSIS — D259 Leiomyoma of uterus, unspecified: Secondary | ICD-10-CM | POA: Diagnosis not present

## 2016-10-26 DIAGNOSIS — Z6827 Body mass index (BMI) 27.0-27.9, adult: Secondary | ICD-10-CM | POA: Diagnosis not present

## 2016-10-26 DIAGNOSIS — Z1231 Encounter for screening mammogram for malignant neoplasm of breast: Secondary | ICD-10-CM | POA: Diagnosis not present

## 2016-11-30 DIAGNOSIS — N852 Hypertrophy of uterus: Secondary | ICD-10-CM | POA: Diagnosis not present

## 2016-12-07 MED FILL — MONTELUKAST SOD 10 MG TAB: 10 | 90 days supply | Qty: 90 | Fill #0

## 2016-12-12 MED FILL — clonazePAM 0.5 MG TABS: 0.5 | 90 days supply | Qty: 180 | Fill #0

## 2016-12-29 MED FILL — PHILITH 0.4-0.035 MG TABLET: 0.4-35 | 84 days supply | Qty: 112 | Fill #0

## 2017-01-04 ENCOUNTER — Ambulatory Visit: Payer: Self-pay

## 2017-01-04 ENCOUNTER — Ambulatory Visit (INDEPENDENT_AMBULATORY_CARE_PROVIDER_SITE_OTHER): Payer: 59 | Admitting: Sports Medicine

## 2017-01-04 ENCOUNTER — Encounter: Payer: Self-pay | Admitting: Sports Medicine

## 2017-01-04 VITALS — BP 112/80 | HR 88

## 2017-01-04 DIAGNOSIS — M25511 Pain in right shoulder: Secondary | ICD-10-CM

## 2017-01-04 DIAGNOSIS — M25561 Pain in right knee: Secondary | ICD-10-CM

## 2017-01-04 DIAGNOSIS — M75101 Unspecified rotator cuff tear or rupture of right shoulder, not specified as traumatic: Secondary | ICD-10-CM | POA: Diagnosis not present

## 2017-01-04 DIAGNOSIS — M25562 Pain in left knee: Secondary | ICD-10-CM | POA: Diagnosis not present

## 2017-01-04 MED ORDER — DICLOFENAC SODIUM 2 % TD SOLN: 1.0000 "application " | Freq: Two times a day (BID) | TRANSDERMAL | 2 refills | Status: DC

## 2017-01-04 NOTE — Patient Instructions (Signed)
Also check out the YouTube Video from Dr. Eric Goodman.  I would like to see you try performing this 5-6 days per week.    A good intro video is: "Independence from Pain 7-minute Video" - https://www.youtube.com/watch?v=[email or web address]   His more advanced video is: "Powerful Posture and Pain Relief: 12 minutes of Foundation Training" - https://youtu.be/[email or web address]   Do not try to attempt this entire video when first beginning.    Try breaking of each exercise that he goes into shorter segments.  Otherwise if they perform an exercise for 45 seconds, start with 15 seconds and rest and then resume with a begin the new activity.  Work your way up to doing this 12 minute video and I expect to see significant improvements in your pain.  

## 2017-01-04 NOTE — Progress Notes (Signed)
OFFICE VISIT NOTE Juanda Bond. 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 Paulla Fore.  SUBJECTIVE:   Chief Complaint  Patient presents with  . New Patient (Initial Visit)    right shoulder pain   HPI: As below and per problem based documentation when appropriate.  Madison Nguyen is a new patient presenting today with complaint of right shoulder pain. Pain is on the anterior aspect of the shoulder.  Pain started about 6 months ago. Pain intensified about 2 weeks ago after going a Zumba class.   The pain is described as intermittent aching and is rated as 2/10 today but can get up to 5/10.  Worsened with shoulder isolating, raising arms above head, extension, reaching back, external rotation, Zumba.  Improves with rest Therapies tried include : She takes Aleve with some relief.   Other associated symptoms include: none  Pt has dx of Lupus    Review of Systems  Constitutional: Negative for chills and fever.  Respiratory: Negative for shortness of breath and wheezing.   Cardiovascular: Negative for chest pain and palpitations.  Musculoskeletal: Positive for joint pain. Negative for falls.  Neurological: Positive for headaches. Negative for dizziness and tingling.  Endo/Heme/Allergies: Does not bruise/bleed easily.    Otherwise per HPI.  HISTORY & PERTINENT PRIOR DATA:  No specialty comments available. She reports that she has never smoked. She has never used smokeless tobacco. No results for input(s): HGBA1C, LABURIC in the last 8760 hours. Medications & Allergies reviewed per EMR Patient Active Problem List   Diagnosis Date Noted  . Rotator cuff syndrome of right shoulder 01/21/2017  . Pain in both knees 03/09/2016  .  Post-operative state 11/24/2013  . Gallstones 10/22/2013  . Cholelithiasis 10/10/2013  . ABNORMALITY OF GAIT 01/27/2010  . KNEE PAIN, BILATERAL 01/25/2010  . CALF PAIN, LEFT 07/06/2009  . MUSCLE STRAIN, RIGHT CALF 06/29/2009  . DIARRHEA 07/22/2007  . ABDOMINAL PAIN 07/22/2007  . MENST MIGRAINE W/INTRACT W/O STATUS MIGRAINOSUS 07/16/2007  . RUQ abdominal pain 10/29/2006   Past Medical History:  Diagnosis Date  . Anxiety   . Gallstones   . GERD (gastroesophageal reflux disease)   . MENST MIGRAINE W/INTRACT W/O STATUS MIGRAINOSUS 07/16/2007   Qualifier: Diagnosis of  By: Council Mechanic MD, Hilaria Ota    Family History  Problem Relation Age of Onset  . Breast cancer Paternal Aunt    Past Surgical History:  Procedure Laterality Date  . BREAST SURGERY  2010   reduction-bilat  . CHOLECYSTECTOMY N/A 11/12/2013   Procedure: LAPAROSCOPIC CHOLECYSTECTOMY;  Surgeon: Joyice Faster. Cornett, MD;  Location: [place];  Service: General;  Laterality: N/A;  . LAPAROSCOPIC CHOLECYSTECTOMY  10/2013   (Cornett)  . TONSILLECTOMY    . URETHRAL DILATION     Social History   Occupational History  . Not on file.   Social History Main Topics  . Smoking status: Never Smoker  . Smokeless tobacco: Never Used  . Alcohol use Yes     Comment: occ  . Drug use: No  . Sexual activity: Yes    Birth control/ protection: Pill    OBJECTIVE:  VS:  HT:    WT:   BMI:     BP:112/80  HR:88bpm  TEMP: ( )  RESP:98 % EXAM: Findings:  WDWN, NAD, Non-toxic appearing Alert & appropriately interactive Not depressed or anxious appearing No increased work of breathing. Pupils are equal. EOM intact without nystagmus No clubbing or cyanosis of the extremities appreciated No significant rashes/lesions/ulcerations overlying the examined area. Radial pulses 2+/4.  No significant generalized UE edema. Sensation intact to light touch in upper extremities.  Right Shoulder Exam: Normal alignment, Normal  Contours No overlying erythema/ecchymosis. No pain or crepitation with axial loading and circumduction TTP over: Anterior shoulder, not focally over the biceps tendon. No TTP over  [facility] joint, posterior shoulder. Internal Rotation: Normal External Rotation: Normal Empty can: Abnormal-slight pain with normal strength Hawkins: Mildly painful Neers: Normal Speeds:Abnormal-slightly painful with strength is intact O'Brien's: Normal      [place] Limited Joint Space Structures Up Right(no Linked Charges)  Result Date: 01/04/2017 Gerda Diss, DO     01/21/2017 11:46 PM LIMITED MSK ULTRASOUND OF Right Shoulder Images were obtained and interpreted by myself, Teresa Coombs, DO Images have been saved and stored to PACS system. Images obtained on: GE S7 Ultrasound machine FINDINGS: Biceps Tendon: Small amount of hypoechoic fluid around the tendon itself,  This is mild Pec Major Insertion: Normal Subscapularis Tendon: Normal Supraspinatus Tendon: Thickened and tender neuropathic changes including increased  thickness and hypoechoic change within the tendon belly.  There does appear to be small interstitial split without retraction or full-thickness involvement. Infraspinatus/Teres Minor Tendon: Normal AC Joint: Normal JOINT: No significant GH spurring appreciated  LABRUM: Not evaluated  IMPRESSION: 1. Supraspinatus tendinopathy   ASSESSMENT & PLAN:     ICD-10-CM   1. Right shoulder pain, unspecified chronicity M25.511 [place] LIMITED JOINT SPACE STRUCTURES UP RIGHT(NO LINKED CHARGES)  2. Rotator cuff syndrome of right shoulder M75.101 Diclofenac Sodium (PENNSAID) 2 % SOLN  3. Pain in both knees, unspecified chronicity M25.561    M25.562   ================================================================= Rotator cuff syndrome of right shoulder Symptoms consistent with supraspinous tendinopathy.  She has history of migraine headaches and a candidate for glycerin.  We will have her try topical pen said as well  as home therapeutic exercises as provided today including scapular stabilization and intrinsic shoulder strengthening.  If any lack of improvement can consider intra-articular injection versus other biologic including PRP.  Pain in both knees Information regarding foundations training provided today as well for helping with core stabilization.  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 ================================================================= Patient Instructions  Also check out the YouTube Video from Dr. Minerva Ends.  I would like to see you try performing this 5-6 days per week.    A good intro video is: "Independence from Pain 7-minute Video" - [email or web address]   His more advanced video is: "Powerful Posture and Pain Relief: 12 minutes of Foundation Training" - https://youtu.be/[email or web address]   Do not try to attempt this entire video when first beginning.    Try breaking of each exercise that he goes into shorter segments.  Otherwise if they perform an exercise for 45 seconds, start with 15 seconds and rest and then resume with a begin the new activity.  Work your way up to doing this 12 minute video and I expect to see significant improvements in your pain.    ================================================================= No future appointments.  Follow-up: Return if symptoms worsen or fail to improve.   CMA/ATC served as Education administrator during this visit. History, Physical, and Plan performed by medical provider. Documentation and orders  reviewed and attested to.      Teresa Coombs, [facility] Physician

## 2017-01-21 DIAGNOSIS — M75101 Unspecified rotator cuff tear or rupture of right shoulder, not specified as traumatic: Secondary | ICD-10-CM | POA: Insufficient documentation

## 2017-01-21 NOTE — Assessment & Plan Note (Signed)
Information regarding foundations training provided today as well for helping with core stabilization.  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

## 2017-01-21 NOTE — Procedures (Signed)
LIMITED MSK ULTRASOUND OF Right Shoulder Images were obtained and interpreted by myself, Teresa Coombs, DO  Images have been saved and stored to PACS system. Images obtained on: GE S7 Ultrasound machine  FINDINGS:  Biceps Tendon: Small amount of hypoechoic fluid around the tendon itself,  This is mild Pec Major Insertion: Normal Subscapularis Tendon: Normal Supraspinatus Tendon: Thickened and tender neuropathic changes including increased  thickness and hypoechoic change within the tendon belly.  There does appear to be small interstitial split without retraction or full-thickness involvement. Infraspinatus/Teres Minor Tendon: Normal AC Joint: Normal JOINT: No significant GH spurring appreciated  LABRUM: Not evaluated    IMPRESSION:  1. Supraspinatus tendinopathy

## 2017-01-21 NOTE — Assessment & Plan Note (Signed)
Symptoms consistent with supraspinous tendinopathy.  She has history of migraine headaches and a candidate for glycerin.  We will have her try topical pen said as well as home therapeutic exercises as provided today including scapular stabilization and intrinsic shoulder strengthening.  If any lack of improvement can consider intra-articular injection versus other biologic including PRP.

## 2017-03-01 DIAGNOSIS — G43009 Migraine without aura, not intractable, without status migrainosus: Secondary | ICD-10-CM | POA: Diagnosis not present

## 2017-03-01 MED FILL — CYCLOBENZAPRINE 10 MG TAB: 10 | 10 days supply | Qty: 30 | Fill #0

## 2017-03-01 MED FILL — GABAPENTIN 600 MG TABLET: 600 | 90 days supply | Qty: 270 | Fill #0

## 2017-03-01 MED FILL — RIZATRIPTAN 10 MG TABLET: 10 | 30 days supply | Qty: 12 | Fill #0

## 2017-03-01 MED FILL — QUETIAPINE FUMARATE 50 MG T: 50 | 15 days supply | Qty: 15 | Fill #0

## 2017-03-15 MED FILL — MONTELUKAST SOD 10 MG TAB: 10 | 90 days supply | Qty: 90 | Fill #1

## 2017-03-18 NOTE — H&P (Signed)
51 year old G 1 P 1 presents with symptomatic fibroids. Ultrasound  14.5 cm uterus with a 12.7 cm fibroid.  Past Medical History:  Diagnosis Date  . Anxiety   . Gallstones   . GERD (gastroesophageal reflux disease)   . MENST MIGRAINE W/INTRACT W/O STATUS MIGRAINOSUS 07/16/2007   Qualifier: Diagnosis of  By: Council Mechanic MD, Hilaria Ota    Past Surgical History:  Procedure Laterality Date  . BREAST SURGERY  2010   reduction-bilat  . CHOLECYSTECTOMY N/A 11/12/2013   Procedure: LAPAROSCOPIC CHOLECYSTECTOMY;  Surgeon: Joyice Faster. Cornett, MD;  Location: [place];  Service: General;  Laterality: N/A;  . LAPAROSCOPIC CHOLECYSTECTOMY  10/2013   (Cornett)  . TONSILLECTOMY    . URETHRAL DILATION     Prior to Admission medications   Medication Sig Start Date End Date Taking? Authorizing Provider  albuterol (PROVENTIL HFA;VENTOLIN HFA) 108 (90 BASE) MCG/ACT inhaler Inhale into the lungs every 6 (six) hours as needed for wheezing or shortness of breath.    [provider]  azelastine (ASTELIN) 137 MCG/SPRAY nasal spray Place 2 sprays into the nose 2 (two) times daily. Use in each nostril as directed 09/06/12   Ria Bush, MD  cetirizine (ZYRTEC) 10 MG tablet Take 10 mg by mouth daily.    [provider]  clonazePAM (KLONOPIN) 0.5 MG tablet TAKE 1-2 TABLETS BY MOUTH EVERY NIGHT AT BEDTIME 01/27/16 01/26/17  [provider]  Diclofenac Sodium (PENNSAID) 2 % SOLN Place 1 application onto the skin 2 (two) times daily. 01/04/17   Gerda Diss, DO  gabapentin (NEURONTIN) 600 MG tablet Take 1 tablet (600 mg total) by mouth 3 (three) times daily. 06/18/14   Ria Bush, MD  montelukast (SINGULAIR) 10 MG tablet Take 10 mg by mouth at bedtime.    [provider]  naproxen sodium (ANAPROX) 220 MG tablet Take 220 mg by mouth as needed.    [provider]  PHILITH 0.4-35 MG-MCG tablet Take 1 tablet by mouth daily. 12/29/16   [provider]  rizatriptan (MAXALT) 10 MG tablet Take by mouth. 09/16/15   [provider]   Allergies Iodine and Penicillins Family History  Problem Relation Age of Onset  . Breast cancer Paternal Aunt    Social History   Social History  . Marital status: Married    Spouse name: N/A  . Number of children: N/A  . Years of education: N/A   Social History Main Topics  . Smoking status: Never Smoker  . Smokeless tobacco: Never Used  . Alcohol use Yes     Comment: occ  . Drug use: No  . Sexual activity: Yes    Birth control/ protection: Pill   Other Topics Concern  . Not on file   Social History Narrative  . No narrative on file   Afebrile Vital signs stable General alert and oriented Lung CTAB Car RRR Abdomen is soft and non tender Uterus 15 week size  IMPRESSION: Symptomatic fibroids  PLAN: TAH BS Consent signed Risks reviewed

## 2017-03-26 MED FILL — BALZIVA 28 TABLET: 0.4-35 | 84 days supply | Qty: 112 | Fill #1

## 2017-03-27 NOTE — Pre-Procedure Instructions (Signed)
The following are in epic: Last office note from [place] 03/01/17 Last office note Dr. Paulla Fore 01/04/17 [place] 01/04/17

## 2017-03-27 NOTE — Patient Instructions (Signed)
Madison Greenspan, MD  03/27/2017   Your procedure is scheduled on: Wednesday, Nov. 7, 2018   Surgery time:  8:30 AM-10:00AM   Report to [place]  elevators to 3rd floor to  [place] at 6:30 AM.   Call this number if you have problems the morning of surgery [phone]    Remember: ONLY 1 PERSON MAY GO WITH YOU TO SHORT STAY TO GET  READY MORNING OF [place].   Do not eat food or drink liquids :After Midnight.     Take these medicines the morning of surgery with A SIP OF WATER: Gabapentin (Neurontin) Use Asthma Inhaler per normal routine and bring day of   surgery              You may not have any metal on your body including hair pins, jewelry, and body piercings             Do not wear make-up, lotions, powders or perfumes, deodorant             Do not wear nail polish.  Do not shave  48 hours prior to surgery.               Do not bring valuables to the hospital. [place].   Contacts, dentures or bridgework may not be worn into surgery.   Leave suitcase in the car. After surgery it may be brought to your room.              Please read over the following fact sheets you were given: _____________________________________________________________________             [facility] - Preparing for Surgery Before surgery, you can play an important role.  Because skin is not sterile, your skin needs to be as free of germs as possible.  You can reduce the number of germs on your skin by washing with CHG (chlorahexidine gluconate) soap before surgery.  CHG is an antiseptic cleaner which kills germs and bonds with the skin to continue killing germs even after washing. Please DO NOT use if you have an allergy to CHG or antibacterial soaps.  If your skin becomes reddened/irritated stop using the CHG and inform your nurse when you arrive at Short Stay. Do not shave (including  legs and underarms) for at least 48 hours prior to the first CHG shower.  You may shave your face/neck.  Please follow these instructions carefully:  1.  Shower with CHG Soap the night before surgery and the  morning of Surgery.  2.  If you choose to wash your hair, wash your hair first as usual with your  normal  shampoo.  3.  After you shampoo, rinse your hair and body thoroughly to remove the  shampoo.                             4.  Use CHG as you would any other liquid soap.  You can apply chg directly  to the skin and wash                       Gently with a scrungie or clean washcloth.  5.  Apply the CHG Soap to your body ONLY FROM THE NECK DOWN.   Do not use on face/ open                           Wound or open sores. Avoid contact with eyes, ears mouth and genitals (private parts).                       Wash face,  Genitals (private parts) with your normal soap.             6.  Wash thoroughly, paying special attention to the area where your surgery  will be performed.  7.  Thoroughly rinse your body with warm water from the neck down.  8.  DO NOT shower/wash with your normal soap after using and rinsing off  the CHG Soap.                9.  Pat yourself dry with a clean towel.            10.  Wear clean pajamas.            11.  Place clean sheets on your bed the night of your first shower and do not  sleep with pets.  Day of Surgery : Do not apply any lotions/deodorants the morning of surgery.  Please wear clean clothes to the hospital/surgery center.  FAILURE TO FOLLOW THESE INSTRUCTIONS MAY RESULT IN THE CANCELLATION OF YOUR SURGERY  PATIENT SIGNATURE_________________________________  NURSE SIGNATURE__________________________________  ________________________________________________________________________   Madison Nguyen  An incentive spirometer is a tool that can help keep your lungs clear and active. This tool measures how well you are filling your lungs with  each breath. Taking long deep breaths may help reverse or decrease the chance of developing breathing (pulmonary) problems (especially infection) following:  A long period of time when you are unable to move or be active. BEFORE THE PROCEDURE   If the spirometer includes an indicator to show your best effort, your nurse or respiratory therapist will set it to a desired goal.  If possible, sit up straight or lean slightly forward. Try not to slouch.  Hold the incentive spirometer in an upright position. INSTRUCTIONS FOR USE  1. Sit on the edge of your bed if possible, or sit up as far as you can in bed or on a chair. 2. Hold the incentive spirometer in an upright position. 3. Breathe out normally. 4. Place the mouthpiece in your mouth and seal your lips tightly around it. 5. Breathe in slowly and as deeply as possible, raising the piston or the ball toward the top of the column. 6. Hold your breath for 3-5 seconds or for as long as possible. Allow the piston or ball to fall to the bottom of the column. 7. Remove the mouthpiece from your mouth and breathe out normally. 8. Rest for a few seconds and repeat Steps 1 through 7 at least 10 times every 1-2 hours when you are awake. Take your time and take a few normal breaths between deep breaths. 9. The spirometer may include an indicator to show your best effort. Use the indicator as a goal to work toward during each repetition. 10. After each set of 10 deep breaths, practice coughing to be sure your lungs are clear. If you have an incision (the cut made at the time of surgery), support your incision when coughing by  placing a pillow or rolled up towels firmly against it. Once you are able to get out of bed, walk around indoors and cough well. You may stop using the incentive spirometer when instructed by your caregiver.  RISKS AND COMPLICATIONS  Take your time so you do not get dizzy or light-headed.  If you are in pain, you may need to take or  ask for pain medication before doing incentive spirometry. It is harder to take a deep breath if you are having pain. AFTER USE  Rest and breathe slowly and easily.  It can be helpful to keep track of a log of your progress. Your caregiver can provide you with a simple table to help with this. If you are using the spirometer at home, follow these instructions: [place] IF:   You are having difficultly using the spirometer.  You have trouble using the spirometer as often as instructed.  Your pain medication is not giving enough relief while using the spirometer.  You develop fever of 100.5 F (38.1 C) or higher. SEEK IMMEDIATE MEDICAL CARE IF:   You cough up bloody sputum that had not been present before.  You develop fever of 102 F (38.9 C) or greater.  You develop worsening pain at or near the incision site. MAKE SURE YOU:   Understand these instructions.  Will watch your condition.  Will get help right away if you are not doing well or get worse. Document Released: 09/25/2006 Document Revised: 08/07/2011 Document Reviewed: 11/26/2006 ExitCare Patient Information 2014 ExitCare, [place].   ________________________________________________________________________  WHAT IS A BLOOD TRANSFUSION? Blood Transfusion Information  A transfusion is the replacement of blood or some of its parts. Blood is made up of multiple cells which provide different functions.  Red blood cells carry oxygen and are used for blood loss replacement.  White blood cells fight against infection.  Platelets control bleeding.  Plasma helps clot blood.  Other blood products are available for specialized needs, such as hemophilia or other clotting disorders. BEFORE THE TRANSFUSION  Who gives blood for transfusions?   Healthy volunteers who are fully evaluated to make sure their blood is safe. This is blood bank blood. Transfusion therapy is the safest it has ever been in the practice of  medicine. Before blood is taken from a donor, a complete history is taken to make sure that person has no history of diseases nor engages in risky social behavior (examples are intravenous drug use or sexual activity with multiple partners). The donor's travel history is screened to minimize risk of transmitting infections, such as malaria. The donated blood is tested for signs of infectious diseases, such as HIV and hepatitis. The blood is then tested to be sure it is compatible with you in order to minimize the chance of a transfusion reaction. If you or a relative donates blood, this is often done in anticipation of surgery and is not appropriate for emergency situations. It takes many days to process the donated blood. RISKS AND COMPLICATIONS Although transfusion therapy is very safe and saves many lives, the main dangers of transfusion include:   Getting an infectious disease.  Developing a transfusion reaction. This is an allergic reaction to something in the blood you were given. Every precaution is taken to prevent this. The decision to have a blood transfusion has been considered carefully by your caregiver before blood is given. Blood is not given unless the benefits outweigh the risks. AFTER THE TRANSFUSION  Right after receiving a blood  transfusion, you will usually feel much better and more energetic. This is especially true if your red blood cells have gotten low (anemic). The transfusion raises the level of the red blood cells which carry oxygen, and this usually causes an energy increase.  The nurse administering the transfusion will monitor you carefully for complications. HOME CARE INSTRUCTIONS  No special instructions are needed after a transfusion. You may find your energy is better. Speak with your caregiver about any limitations on activity for underlying diseases you may have. SEEK MEDICAL CARE IF:   Your condition is not improving after your transfusion.  You develop  redness or irritation at the intravenous (IV) site. SEEK IMMEDIATE MEDICAL CARE IF:  Any of the following symptoms occur over the next 12 hours:  Shaking chills.  You have a temperature by mouth above 102 F (38.9 C), not controlled by medicine.  Chest, back, or muscle pain.  People around you feel you are not acting correctly or are confused.  Shortness of breath or difficulty breathing.  Dizziness and fainting.  You get a rash or develop hives.  You have a decrease in urine output.  Your urine turns a dark color or changes to pink, red, or brown. Any of the following symptoms occur over the next 10 days:  You have a temperature by mouth above 102 F (38.9 C), not controlled by medicine.  Shortness of breath.  Weakness after normal activity.  The white part of the eye turns yellow (jaundice).  You have a decrease in the amount of urine or are urinating less often.  Your urine turns a dark color or changes to pink, red, or brown. Document Released: 05/12/2000 Document Revised: 08/07/2011 Document Reviewed: 12/30/2007 [facility] Patient Information 2014 [place], [place].  _______________________________________________________________________

## 2017-03-29 ENCOUNTER — Encounter (HOSPITAL_COMMUNITY)
Admission: RE | Admit: 2017-03-29 | Discharge: 2017-03-29 | Disposition: A | Discharge status: Home / Self Care | Payer: 59 | Source: Ambulatory Visit | Attending: Obstetrics and Gynecology | Admitting: Obstetrics and Gynecology

## 2017-03-29 ENCOUNTER — Encounter (HOSPITAL_COMMUNITY): Payer: Self-pay

## 2017-03-29 DIAGNOSIS — Z01818 Encounter for other preprocedural examination: Secondary | ICD-10-CM | POA: Insufficient documentation

## 2017-03-29 DIAGNOSIS — D259 Leiomyoma of uterus, unspecified: Secondary | ICD-10-CM | POA: Insufficient documentation

## 2017-03-29 HISTORY — DX: Allergic rhinitis, unspecified: J30.9

## 2017-03-29 HISTORY — DX: Systemic lupus erythematosus, unspecified: M32.9

## 2017-03-29 HISTORY — DX: Unspecified asthma, uncomplicated: J45.909

## 2017-03-29 LAB — CBC
HCT: 42.9 % (ref 36.0–46.0)
Hemoglobin: 14.4 g/dL (ref 12.0–15.0)
MCH: 29.6 pg (ref 26.0–34.0)
MCHC: 33.6 g/dL (ref 30.0–36.0)
MCV: 88.1 fL (ref 78.0–100.0)
Platelets: 165 10*3/uL (ref 150–400)
RBC: 4.87 MIL/uL (ref 3.87–5.11)
RDW: 13.2 % (ref 11.5–15.5)
WBC: 7.5 10*3/uL (ref 4.0–10.5)

## 2017-03-29 LAB — ABO/RH: ABO/RH(D): A POS

## 2017-04-02 ENCOUNTER — Other Ambulatory Visit: Payer: Self-pay | Admitting: *Deleted

## 2017-04-02 NOTE — Patient Outreach (Signed)
[facility]) Care Management  04/02/2017  Abner Greenspan, MD 1965/07/05 [id number]   Subjective: Telephone call to patient's home number, no answer, left HIPAA compliant voicemail message, and requested call back.    Objective:Per KPN (Knowledge Performance Now, point of care tool) and chart review, patient to be admitted 04/04/17 for HYSTERECTOMY ABDOMINAL and BILATERAL SALPINGECTOMY at [facility].   Patient has a history of migraines.     Assessment: Received UMR Preoperative / Transition of care referral on 03/23/17.  Preoperative call follow up pending patient contact.     Plan: RNCM will call patient for 2nd telephone outreach attempt, preoperative call follow up, within 10 business days if no return call.     Esraa Seres H. Annia Friendly, BSN, [place] Management [facility] Telephonic CM Phone: [phone] Fax: [phone]

## 2017-04-03 ENCOUNTER — Other Ambulatory Visit: Payer: Self-pay | Admitting: *Deleted

## 2017-04-03 ENCOUNTER — Ambulatory Visit: Payer: Self-pay | Admitting: *Deleted

## 2017-04-03 MED ORDER — GENTAMICIN SULFATE 40 MG/ML IJ SOLN
300.0000 mg | INTRAVENOUS | Status: AC
  Administered 2017-04-04: 300 mg via INTRAVENOUS
  Filled 2017-04-03: qty 7.5

## 2017-04-03 MED ORDER — CLINDAMYCIN PHOSPHATE 900 MG/50ML IV SOLN
900.0000 mg | INTRAVENOUS | Status: AC
  Administered 2017-04-04: 900 mg via INTRAVENOUS

## 2017-04-03 NOTE — Patient Outreach (Addendum)
[facility]) Care Management  04/03/2017  Abner Greenspan, MD [birth year] [id number]   Subjective: Received voicemail message from Dr. Loura Pardon, states she is returning call, and requested call back on mobile number [phone]).  Telephone call to patient's home number, no answer, left HIPAA compliant voicemail message, and requested call back. Telephone call from patient's mobile number, spoke with patient, and HIPAA verified (name, verified mobile number via voicemail message).    Discussed [facility] Care Management UMR Transition of care follow up, preoperative call follow up, patient voiced understanding, and is in agreement to both types of follow up.    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  States she is currently a work and can only speak with this RNCM briefly.   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   States she has started family medical leave act [employer]) process, RNCM advised her to maintain a copy of all FMLA paperwork for her records, to follow up on paperwork completion, patient voiced understanding, and is in agreement to follow up.  States she is accessing the following [facility] benefits: outpatient pharmacy and hospital indemnity (did not chose this benefit).  Patient voices understanding of medical diagnosis, pending surgery,  and treatment plan. Patient states she does not have any preoperative questions, care coordination, disease management, disease monitoring, transportation, community resource, or pharmacy needs at this time. States she is very appreciative of the follow up and is in agreement to receive [place] Management information post transition of care follow up.  Telephone call to patient's  mobile number, no answer, left HIPAA compliant voicemail message,  and requested call back.    Objective:Per KPN (Knowledge Performance Now, point of care tool) and chart review, patient to be admitted 04/04/17 for HYSTERECTOMY ABDOMINAL and BILATERAL SALPINGECTOMY at [facility].   Patient has a history of migraines.     Assessment: Received UMR Preoperative / Transition of care referral on 03/23/17.  Preoperative call completed, and transition of care follow up pending notification of patient discharge.    Plan: RNCM will call patient for  telephone outreach attempt, transition of care follow up, within 3 business days of hospital discharge notification.      Kelie Gainey H. Annia Friendly, BSN, [place] Management [facility] Telephonic CM Phone: [phone] Fax: [phone]

## 2017-04-03 NOTE — Patient Outreach (Signed)
[facility]) Care Management  04/03/2017  Abner Greenspan, MD 03/05/1966 [id number]   Subjective: Telephone call from patient's mobile number, spoke with patient, and HIPAA verified.   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  Also discussed strategies to decrease constipation, voices understanding, and is in agreement to follow up.  States she is very appreciative of the follow up and is in agreement to receive [place] Management information post transition of care follow up.    Objective:Per KPN (Knowledge Performance Now, point of care tool) and chart review,patient to be admitted 04/04/17 forHYSTERECTOMY [place] [facility]. Patient has a history of migraines.     Assessment: Received UMR Preoperative / Transition of care referral on 03/23/17.Preoperative call completed, and transition of care follow up pending notification of patient discharge.    Plan:RNCM will call patient for  telephone outreach attempt, transition of care follow up, within 3 business days of hospital discharge notification.      Sharlie Shreffler H. Annia Friendly, BSN, [place] Management [facility] Telephonic CM Phone: [phone] Fax: [phone]

## 2017-04-04 ENCOUNTER — Encounter (HOSPITAL_COMMUNITY)
Admission: RE | Disposition: A | Discharge status: Home / Self Care | Payer: Self-pay | Source: Ambulatory Visit | Attending: Obstetrics and Gynecology

## 2017-04-04 ENCOUNTER — Inpatient Hospital Stay (HOSPITAL_COMMUNITY)
Admission: RE | Admit: 2017-04-04 | Discharge: 2017-04-05 | DRG: 743 | Disposition: A | Discharge status: Home / Self Care | Payer: 59 | Source: Ambulatory Visit | Attending: Obstetrics and Gynecology | Admitting: Obstetrics and Gynecology

## 2017-04-04 ENCOUNTER — Inpatient Hospital Stay (HOSPITAL_COMMUNITY): Payer: 59 | Admitting: Registered Nurse

## 2017-04-04 ENCOUNTER — Other Ambulatory Visit: Payer: Self-pay

## 2017-04-04 ENCOUNTER — Encounter (HOSPITAL_COMMUNITY): Payer: Self-pay | Admitting: Emergency Medicine

## 2017-04-04 DIAGNOSIS — D219 Benign neoplasm of connective and other soft tissue, unspecified: Secondary | ICD-10-CM | POA: Diagnosis present

## 2017-04-04 DIAGNOSIS — G43909 Migraine, unspecified, not intractable, without status migrainosus: Secondary | ICD-10-CM | POA: Diagnosis present

## 2017-04-04 DIAGNOSIS — F419 Anxiety disorder, unspecified: Secondary | ICD-10-CM | POA: Diagnosis present

## 2017-04-04 DIAGNOSIS — D259 Leiomyoma of uterus, unspecified: Secondary | ICD-10-CM | POA: Diagnosis not present

## 2017-04-04 DIAGNOSIS — Z79899 Other long term (current) drug therapy: Secondary | ICD-10-CM

## 2017-04-04 DIAGNOSIS — K219 Gastro-esophageal reflux disease without esophagitis: Secondary | ICD-10-CM | POA: Diagnosis not present

## 2017-04-04 HISTORY — PX: BILATERAL SALPINGECTOMY: SHX5743

## 2017-04-04 HISTORY — PX: ABDOMINAL HYSTERECTOMY: SHX81

## 2017-04-04 LAB — PREGNANCY, URINE: PREG TEST UR: NEGATIVE

## 2017-04-04 LAB — TYPE AND SCREEN
ABO/RH(D): A POS
ANTIBODY SCREEN: NEGATIVE

## 2017-04-04 SURGERY — HYSTERECTOMY, ABDOMINAL
Anesthesia: General

## 2017-04-04 MED ORDER — LIDOCAINE 2% (20 MG/ML) 5 ML SYRINGE
INTRAMUSCULAR | Status: DC | PRN
  Administered 2017-04-04: 60 mg via INTRAVENOUS

## 2017-04-04 MED ORDER — KETOROLAC TROMETHAMINE 30 MG/ML IJ SOLN
INTRAMUSCULAR | Status: AC
  Filled 2017-04-04: qty 1

## 2017-04-04 MED ORDER — LIDOCAINE 2% (20 MG/ML) 5 ML SYRINGE
INTRAMUSCULAR | Status: DC | PRN
  Administered 2017-04-04: 1.5 mg/kg/h via INTRAVENOUS

## 2017-04-04 MED ORDER — HYDROMORPHONE 1 MG/ML IV SOLN
INTRAVENOUS | Status: DC
  Administered 2017-04-04: 1 mg via INTRAVENOUS
  Administered 2017-04-04: 0.8 mg via INTRAVENOUS
  Administered 2017-04-05: 0.6 mg via INTRAVENOUS
  Administered 2017-04-05: 0.4 mg via INTRAVENOUS
  Filled 2017-04-04: qty 25

## 2017-04-04 MED ORDER — PROPOFOL 10 MG/ML IV BOLUS
INTRAVENOUS | Status: AC
  Filled 2017-04-04: qty 20

## 2017-04-04 MED ORDER — MIDAZOLAM HCL 5 MG/5ML IJ SOLN
INTRAMUSCULAR | Status: DC | PRN
  Administered 2017-04-04: 2 mg via INTRAVENOUS

## 2017-04-04 MED ORDER — LIDOCAINE 2% (20 MG/ML) 5 ML SYRINGE
INTRAMUSCULAR | Status: AC
  Filled 2017-04-04: qty 5

## 2017-04-04 MED ORDER — LACTATED RINGERS IV SOLN
INTRAVENOUS | Status: DC
  Administered 2017-04-04 (×2): via INTRAVENOUS

## 2017-04-04 MED ORDER — ROCURONIUM BROMIDE 50 MG/5ML IV SOSY
PREFILLED_SYRINGE | INTRAVENOUS | Status: AC
  Filled 2017-04-04: qty 5

## 2017-04-04 MED ORDER — ACETAMINOPHEN 10 MG/ML IV SOLN
INTRAVENOUS | Status: AC
  Filled 2017-04-04: qty 100

## 2017-04-04 MED ORDER — MIDAZOLAM HCL 2 MG/2ML IJ SOLN
INTRAMUSCULAR | Status: AC
  Filled 2017-04-04: qty 2

## 2017-04-04 MED ORDER — SUGAMMADEX SODIUM 200 MG/2ML IV SOLN
INTRAVENOUS | Status: DC | PRN
  Administered 2017-04-04: 200 mg via INTRAVENOUS

## 2017-04-04 MED ORDER — OXYCODONE HCL 5 MG PO TABS: 5.0000 mg | ORAL_TABLET | Freq: Once | ORAL | Status: DC | PRN

## 2017-04-04 MED ORDER — CLONAZEPAM 0.5 MG PO TABS
0.5000 mg | ORAL_TABLET | Freq: Every day | ORAL | Status: DC
  Administered 2017-04-04: 0.5 mg via ORAL
  Filled 2017-04-04: qty 1

## 2017-04-04 MED ORDER — PROMETHAZINE HCL 25 MG/ML IJ SOLN: 6.2500 mg | INTRAMUSCULAR | Status: DC | PRN

## 2017-04-04 MED ORDER — SUGAMMADEX SODIUM 200 MG/2ML IV SOLN
INTRAVENOUS | Status: AC
  Filled 2017-04-04: qty 2

## 2017-04-04 MED ORDER — HYDROMORPHONE HCL 1 MG/ML IJ SOLN
INTRAMUSCULAR | Status: AC
  Administered 2017-04-04: 0.5 mg via INTRAVENOUS
  Filled 2017-04-04: qty 2

## 2017-04-04 MED ORDER — DEXAMETHASONE SODIUM PHOSPHATE 10 MG/ML IJ SOLN
INTRAMUSCULAR | Status: AC
  Filled 2017-04-04: qty 1

## 2017-04-04 MED ORDER — LACTATED RINGERS IV SOLN
INTRAVENOUS | Status: DC
  Administered 2017-04-04 (×2): via INTRAVENOUS

## 2017-04-04 MED ORDER — ACETAMINOPHEN 10 MG/ML IV SOLN
INTRAVENOUS | Status: DC | PRN
  Administered 2017-04-04: 1000 mg via INTRAVENOUS

## 2017-04-04 MED ORDER — DEXAMETHASONE SODIUM PHOSPHATE 10 MG/ML IJ SOLN
INTRAMUSCULAR | Status: DC | PRN
  Administered 2017-04-04: 10 mg via INTRAVENOUS

## 2017-04-04 MED ORDER — FENTANYL CITRATE (PF) 250 MCG/5ML IJ SOLN
INTRAMUSCULAR | Status: DC | PRN
  Administered 2017-04-04: 100 ug via INTRAVENOUS
  Administered 2017-04-04 (×3): 50 ug via INTRAVENOUS

## 2017-04-04 MED ORDER — DIPHENHYDRAMINE HCL 50 MG/ML IJ SOLN: 12.5000 mg | Freq: Four times a day (QID) | INTRAMUSCULAR | Status: DC | PRN

## 2017-04-04 MED ORDER — ONDANSETRON HCL 4 MG/2ML IJ SOLN
INTRAMUSCULAR | Status: AC
  Filled 2017-04-04: qty 2

## 2017-04-04 MED ORDER — MONTELUKAST SODIUM 10 MG PO TABS: 10.0000 mg | ORAL_TABLET | Freq: Every day | ORAL | Status: DC

## 2017-04-04 MED ORDER — NALOXONE HCL 0.4 MG/ML IJ SOLN: 0.4000 mg | INTRAMUSCULAR | Status: DC | PRN

## 2017-04-04 MED ORDER — SODIUM CHLORIDE 0.9% FLUSH: 9.0000 mL | INTRAVENOUS | Status: DC | PRN

## 2017-04-04 MED ORDER — ONDANSETRON HCL 4 MG/2ML IJ SOLN
INTRAMUSCULAR | Status: DC | PRN
  Administered 2017-04-04: 4 mg via INTRAVENOUS

## 2017-04-04 MED ORDER — PROPOFOL 10 MG/ML IV BOLUS
INTRAVENOUS | Status: DC | PRN
  Administered 2017-04-04: 180 mg via INTRAVENOUS

## 2017-04-04 MED ORDER — HYDROMORPHONE HCL 1 MG/ML IJ SOLN
0.2500 mg | INTRAMUSCULAR | Status: DC | PRN
  Administered 2017-04-04 (×4): 0.5 mg via INTRAVENOUS

## 2017-04-04 MED ORDER — ONDANSETRON HCL 4 MG/2ML IJ SOLN
4.0000 mg | Freq: Four times a day (QID) | INTRAMUSCULAR | Status: DC | PRN
Start: 2017-04-04 — End: 2017-04-05

## 2017-04-04 MED ORDER — TRAMADOL HCL 50 MG PO TABS
50.0000 mg | ORAL_TABLET | Freq: Four times a day (QID) | ORAL | Status: DC | PRN
  Administered 2017-04-04 – 2017-04-05 (×2): 50 mg via ORAL
  Filled 2017-04-04 (×2): qty 1

## 2017-04-04 MED ORDER — ROCURONIUM BROMIDE 10 MG/ML (PF) SYRINGE
PREFILLED_SYRINGE | INTRAVENOUS | Status: DC | PRN
  Administered 2017-04-04: 10 mg via INTRAVENOUS
  Administered 2017-04-04: 50 mg via INTRAVENOUS

## 2017-04-04 MED ORDER — ALBUTEROL SULFATE (2.5 MG/3ML) 0.083% IN NEBU: 2.5000 mg | INHALATION_SOLUTION | Freq: Four times a day (QID) | RESPIRATORY_TRACT | Status: DC | PRN

## 2017-04-04 MED ORDER — OXYCODONE HCL 5 MG/5ML PO SOLN
5.0000 mg | Freq: Once | ORAL | Status: DC | PRN
  Filled 2017-04-04: qty 5

## 2017-04-04 MED ORDER — CLINDAMYCIN PHOSPHATE 900 MG/50ML IV SOLN
INTRAVENOUS | Status: AC
  Filled 2017-04-04: qty 50

## 2017-04-04 MED ORDER — 0.9 % SODIUM CHLORIDE (POUR BTL) OPTIME
TOPICAL | Status: DC | PRN
  Administered 2017-04-04: 1000 mL

## 2017-04-04 MED ORDER — MENTHOL 3 MG MT LOZG: 1.0000 | LOZENGE | OROMUCOSAL | Status: DC | PRN

## 2017-04-04 MED ORDER — DIPHENHYDRAMINE HCL 12.5 MG/5ML PO ELIX: 12.5000 mg | ORAL_SOLUTION | Freq: Four times a day (QID) | ORAL | Status: DC | PRN

## 2017-04-04 MED ORDER — IBUPROFEN 200 MG PO TABS: 600.0000 mg | ORAL_TABLET | Freq: Four times a day (QID) | ORAL | Status: DC | PRN

## 2017-04-04 MED ORDER — KETOROLAC TROMETHAMINE 30 MG/ML IJ SOLN
30.0000 mg | Freq: Once | INTRAMUSCULAR | Status: AC
  Administered 2017-04-04: 30 mg via INTRAVENOUS

## 2017-04-04 MED ORDER — FENTANYL CITRATE (PF) 250 MCG/5ML IJ SOLN
INTRAMUSCULAR | Status: AC
  Filled 2017-04-04: qty 5

## 2017-04-04 MED ORDER — ALBUTEROL SULFATE HFA 108 (90 BASE) MCG/ACT IN AERS: 2.0000 | INHALATION_SPRAY | Freq: Four times a day (QID) | RESPIRATORY_TRACT | Status: DC | PRN

## 2017-04-04 MED ORDER — GABAPENTIN 300 MG PO CAPS
600.0000 mg | ORAL_CAPSULE | Freq: Three times a day (TID) | ORAL | Status: DC
  Administered 2017-04-04: 600 mg via ORAL
  Filled 2017-04-04: qty 2

## 2017-04-04 SURGICAL SUPPLY — 25 items
ADH SKN CLS APL DERMABOND .7 (GAUZE/BANDAGES/DRESSINGS) ×2
CHLORAPREP W/TINT 26ML (MISCELLANEOUS) ×4 IMPLANT
COVER SURGICAL LIGHT HANDLE (MISCELLANEOUS) ×4 IMPLANT
DERMABOND ADVANCED (GAUZE/BANDAGES/DRESSINGS) ×2
DERMABOND ADVANCED .7 DNX12 (GAUZE/BANDAGES/DRESSINGS) ×2 IMPLANT
DRAPE LAPAROSCOPIC ABDOMINAL (DRAPES) ×4 IMPLANT
DRAPE UTILITY XL STRL (DRAPES) ×6 IMPLANT
DRAPE WARM FLUID 44X44 (DRAPE) ×4 IMPLANT
DRSG OPSITE POSTOP 4X8 (GAUZE/BANDAGES/DRESSINGS) ×2 IMPLANT
ELECT REM PT RETURN 15FT ADLT (MISCELLANEOUS) ×4 IMPLANT
GAUZE SPONGE 4X4 16PLY XRAY LF (GAUZE/BANDAGES/DRESSINGS) IMPLANT
GLOVE BIO SURGEON STRL SZ 6.5 (GLOVE) ×4 IMPLANT
GLOVE BIO SURGEONS STRL SZ 6.5 (GLOVE) ×2
GLOVE INDICATOR 6.5 STRL GRN (GLOVE) ×4 IMPLANT
HEMOSTAT ARISTA ABSORB 3G PWDR (MISCELLANEOUS) ×2 IMPLANT
NS IRRIG 1000ML POUR BTL (IV SOLUTION) ×4 IMPLANT
PACK GENERAL/GYN (CUSTOM PROCEDURE TRAY) ×4 IMPLANT
SUT VIC AB 0 CT1 18XCR BRD 8 (SUTURE) ×4 IMPLANT
SUT VIC AB 0 CT1 36 (SUTURE) ×8 IMPLANT
SUT VIC AB 0 CT1 8-18 (SUTURE) ×12
SUT VIC AB 4-0 PS2 27 (SUTURE) ×4 IMPLANT
SUT VICRYL 0 TIES 12 18 (SUTURE) ×4 IMPLANT
TOWEL OR 17X26 10 PK STRL BLUE (TOWEL DISPOSABLE) ×4 IMPLANT
TOWEL OR NON WOVEN STRL DISP B (DISPOSABLE) ×4 IMPLANT
TRAY FOLEY W/METER SILVER 16FR (SET/KITS/TRAYS/PACK) ×4 IMPLANT

## 2017-04-04 NOTE — Anesthesia Postprocedure Evaluation (Signed)
Anesthesia Post Note  Patient: Abner Greenspan, MD  Procedure(s) Performed: HYSTERECTOMY ABDOMINAL (N/A ) BILATERAL SALPINGECTOMY (Bilateral )     Patient location during evaluation: PACU Anesthesia Type: General Level of consciousness: awake and alert Pain management: pain level controlled Vital Signs Assessment: post-procedure vital signs reviewed and stable Respiratory status: spontaneous breathing, nonlabored ventilation, respiratory function stable and patient connected to nasal cannula oxygen Cardiovascular status: blood pressure returned to baseline and stable Postop Assessment: no apparent nausea or vomiting Anesthetic complications: no    Last Vitals:  Vitals:   04/04/17 1321 04/04/17 1427  BP: 110/62 114/74  Pulse: 93 88  Resp: 15 15  Temp: 36.8 C 37 C  SpO2: 100% 100%    Last Pain:  Vitals:   04/04/17 1427  TempSrc: Oral  PainSc:                  Ty Buntrock S

## 2017-04-04 NOTE — Brief Op Note (Signed)
04/04/2017  9:55 AM  PATIENT:  Madison Greenspan, MD  51 y.o. female  PRE-OPERATIVE DIAGNOSIS:  fibroids  POST-OPERATIVE DIAGNOSIS:  fibroids  PROCEDURE:  Procedure(s): HYSTERECTOMY ABDOMINAL (N/A) BILATERAL SALPINGECTOMY (Bilateral)  SURGEON:  Surgeon(s) and Role:    * Dian Queen, MD - Primary    * Maisie Fus, MD - Assisting  PHYSICIAN ASSISTANT:   ASSISTANTS: none   ANESTHESIA:   general  EBL:  300 mL   BLOOD ADMINISTERED:none  DRAINS: Urinary Catheter (Foley)   LOCAL MEDICATIONS USED:  NONE  SPECIMEN:  Source of Specimen:  cervix, uterus and tubes  DISPOSITION OF SPECIMEN:  PATHOLOGY  COUNTS:  YES  TOURNIQUET:  * No tourniquets in log *  DICTATION: .Other Dictation: Dictation Number dictated  PLAN OF CARE: Admit to inpatient   PATIENT DISPOSITION:  PACU - hemodynamically stable.   Delay start of Pharmacological VTE agent (>24hrs) due to surgical blood loss or risk of bleeding: not applicable

## 2017-04-04 NOTE — Anesthesia Preprocedure Evaluation (Signed)
Anesthesia Evaluation  Patient identified by MRN, date of birth, ID band Patient awake    Reviewed: Allergy & Precautions, NPO status , Patient's Chart, lab work & pertinent test results  Airway Mallampati: II  TM Distance: >3 FB Neck ROM: Full    Dental no notable dental hx.    Pulmonary neg pulmonary ROS,    Pulmonary exam normal breath sounds clear to auscultation       Cardiovascular negative cardio ROS Normal cardiovascular exam Rhythm:Regular Rate:Normal     Neuro/Psych negative neurological ROS  negative psych ROS   GI/Hepatic Neg liver ROS, GERD  ,  Endo/Other  negative endocrine ROS  Renal/GU negative Renal ROS  negative genitourinary   Musculoskeletal negative musculoskeletal ROS (+)   Abdominal   Peds negative pediatric ROS (+)  Hematology negative hematology ROS (+)   Anesthesia Other Findings   Reproductive/Obstetrics negative OB ROS                             Anesthesia Physical Anesthesia Plan  ASA: II  Anesthesia Plan: General   Post-op Pain Management:    Induction: Intravenous  PONV Risk Score and Plan: 3 and Ondansetron, Dexamethasone, Treatment may vary due to age or medical condition and Scopolamine patch - Pre-op  Airway Management Planned: Oral ETT  Additional Equipment:   Intra-op Plan:   Post-operative Plan: Extubation in OR  Informed Consent: I have reviewed the patients History and Physical, chart, labs and discussed the procedure including the risks, benefits and alternatives for the proposed anesthesia with the patient or authorized representative who has indicated his/her understanding and acceptance.   Dental advisory given  Plan Discussed with: CRNA and Surgeon  Anesthesia Plan Comments:         Anesthesia Quick Evaluation

## 2017-04-04 NOTE — Progress Notes (Signed)
Patient wants foley catheter d/c tomorrow morning. MD made aware.

## 2017-04-04 NOTE — Transfer of Care (Signed)
Immediate Anesthesia Transfer of Care Note  Patient: Madison Greenspan, MD  Procedure(s) Performed: HYSTERECTOMY ABDOMINAL (N/A ) BILATERAL SALPINGECTOMY (Bilateral )  Patient Location: PACU  Anesthesia Type:General  Level of Consciousness: awake, alert  and oriented  Airway & Oxygen Therapy: Patient Spontanous Breathing and Patient connected to face mask oxygen  Post-op Assessment: Report given to RN and Post -op Vital signs reviewed and stable  Post vital signs: Reviewed and stable  Last Vitals:  Vitals:   04/04/17 0632  BP: 118/78  Pulse: 78  Resp: 16  Temp: 37.2 C  SpO2: 100%    Last Pain:  Vitals:   04/04/17 2395  TempSrc: Oral         Complications: No apparent anesthesia complications

## 2017-04-04 NOTE — Anesthesia Procedure Notes (Signed)
Procedure Name: Intubation Date/Time: 04/04/2017 8:40 AM Performed by: Talbot Grumbling, CRNA Pre-anesthesia Checklist: Patient identified, Emergency Drugs available, Suction available and Patient being monitored Oxygen Delivery Method: Circle system utilized Preoxygenation: Pre-oxygenation with 100% oxygen Induction Type: IV induction Ventilation: Mask ventilation without difficulty Laryngoscope Size: Mac and 3 Grade View: Grade I Tube type: Oral Tube size: 7.5 mm Number of attempts: 1 Airway Equipment and Method: Stylet Placement Confirmation: ETT inserted through vocal cords under direct vision,  positive ETCO2 and breath sounds checked- equal and bilateral Secured at: 21 cm Tube secured with: Tape Dental Injury: Teeth and Oropharynx as per pre-operative assessment

## 2017-04-04 NOTE — Progress Notes (Signed)
H and P on chart No changes Will proceed with TAH, BS Consent signed

## 2017-04-05 LAB — BASIC METABOLIC PANEL
ANION GAP: 7 (ref 5–15)
BUN: 7 mg/dL (ref 6–20)
CHLORIDE: 104 mmol/L (ref 101–111)
CO2: 25 mmol/L (ref 22–32)
CREATININE: 0.7 mg/dL (ref 0.44–1.00)
Calcium: 8.2 mg/dL — ABNORMAL LOW (ref 8.9–10.3)
GFR calc non Af Amer: >60 mL/min (ref >60)
Glucose, Bld: 132 mg/dL — ABNORMAL HIGH (ref 65–99)
POTASSIUM: 3.8 mmol/L (ref 3.5–5.1)
Sodium: 136 mmol/L (ref 135–145)

## 2017-04-05 LAB — CBC
HCT: 35.9 % — ABNORMAL LOW (ref 36.0–46.0)
HEMOGLOBIN: 12.1 g/dL (ref 12.0–15.0)
MCH: 29.7 pg (ref 26.0–34.0)
MCHC: 33.7 g/dL (ref 30.0–36.0)
MCV: 88.2 fL (ref 78.0–100.0)
PLATELETS: 143 10*3/uL — AB (ref 150–400)
RBC: 4.07 MIL/uL (ref 3.87–5.11)
RDW: 13 % (ref 11.5–15.5)
WBC: 17.1 10*3/uL — AB (ref 4.0–10.5)

## 2017-04-05 MED ORDER — IBUPROFEN 600 MG PO TABS: 600.0000 mg | ORAL_TABLET | Freq: Four times a day (QID) | ORAL | 0 refills | Status: DC | PRN

## 2017-04-05 MED ORDER — METHOCARBAMOL 500 MG PO TABS: 500.0000 mg | ORAL_TABLET | Freq: Four times a day (QID) | ORAL | 0 refills | Status: DC | PRN

## 2017-04-05 MED ORDER — HYDROCODONE-ACETAMINOPHEN 5-325 MG PO TABS: 1.0000 | ORAL_TABLET | ORAL | 0 refills | Status: DC | PRN

## 2017-04-05 MED ORDER — HYDROCODONE-ACETAMINOPHEN 5-325 MG PO TABS: 1.0000 | ORAL_TABLET | ORAL | Status: DC | PRN

## 2017-04-05 MED ORDER — METHOCARBAMOL 500 MG PO TABS
500.0000 mg | ORAL_TABLET | Freq: Four times a day (QID) | ORAL | Status: DC | PRN
Start: 2017-04-05 — End: 2017-04-05

## 2017-04-05 MED FILL — HYDROCODON-APAP 5-325: 5-325 | 3 days supply | Qty: 30 | Fill #0

## 2017-04-05 MED FILL — METHOCARBAMOL 500 MG TABLET: 500 | 7 days supply | Qty: 30 | Fill #0

## 2017-04-05 MED FILL — IBUPROFEN 600 MG TABLET: 600 | 15 days supply | Qty: 60 | Fill #0

## 2017-04-05 NOTE — Op Note (Signed)
Madison Nguyen, Madison Nguyen                 ACCOUNT NO.:  [id number]  MEDICAL RECORD NO.:  [id number]  LOCATION:                                 FACILITY:  PHYSICIAN:  Warner Laduca L. Helane Rima, M.D.    DATE OF BIRTH:  DATE OF PROCEDURE: DATE OF DISCHARGE:                              OPERATIVE REPORT   PREOPERATIVE DIAGNOSIS:  Symptomatic fibroids.  POSTOPERATIVE DIAGNOSIS:  Symptomatic fibroids.  PROCEDURE:  Total abdominal hysterectomy and bilateral salpingectomy.  ASSISTANT:  Dr. Evette Cristal.  ESTIMATED BLOOD LOSS:  300 mL.  COMPLICATIONS:  None.  DRAINS:  Foley catheter.  PATHOLOGY:  Cervix, uterus, and fallopian tubes.  DESCRIPTION OF PROCEDURE:  The patient was taken to the operating room after informed consent was obtained in regard to her risk associated with the procedure.  The patient was intubated in the operating room. She was then prepped and draped in the usual sterile fashion.  A Foley catheter had been inserted.  A low-transverse incision was made and carried down to the fascia.  Fascia was scored in the midline, extended laterally.  Rectus muscles were separated in the midline.  The peritoneum was stretched.  The peritoneal incision was extended and then I inspected her pelvis.  Her uterus was enlarged about 16-week size with a large fibroid.  Fallopian tubes and ovaries appeared normal.  The uterus was exteriorized.  Curved Haney clamps were placed across the triple pedicle on each side.  Each pedicle was clamped, cut, and suture ligated using 0 Vicryl suture.  We then went down to the bladder flap, created sharply and then blunt dissection to remove the bladder flap away from the cervix.  Curved Haney clamps were placed at the level of the internal os.  Each pedicle was clamped, cut, and suture ligated using 0 Vicryl suture.  We then placed a series of straight clamps snug beside the uterosacral cardinal ligaments.  Each pedicle was clamped, cut, and suture ligated  using 0 Vicryl suture.  I then placed a curved Heaney clamp just beneath the external os with careful attention that the bladder was well away from our clamps.  Each pedicle was clamped, cut, and suture ligated using 0 Vicryl suture.  The remainder of the cuff was closed with a figure-of-eight using 0 Vicryl suture.  We went back up to the adnexa and removed the fallopian tube segments with curved Haney clamps and secured the pedicles with 0 Vicryl suture. Irrigation was performed.  Hemostasis was very good, but I did place Arista across the entire pelvis and vaginal cuff and sidewall pedicles for further hemostasis postoperatively.  The peritoneum was closed using 0 Vicryl.  The fascia was closed using 0 Vicryl in a running stitch.  The skin was closed with 4-0 Vicryl in a running stitch. Dermabond was applied.  A honeycomb dressing was applied.  All sponge, lap, and instrument counts were correct x2.  Urine output was good and urine output was clear.  The patient was extubated and went to recovery room in stable condition.     Nandana Krolikowski L. Helane Rima, M.D.     [name]  D:  04/04/2017  T:  04/05/2017  Job:  [id number] 

## 2017-04-05 NOTE — Discharge Summary (Signed)
Admission Diagnosis: Symptomatic Fibroids  Discharge Diagnosis: Same  Hospital Course: 51 year old female admitted with symptomatic fibroids. She did very well post op. By POD # 1 she was ambulating, voiding, tolerating regular diet.  BP 108/73 (BP Location: Left Arm)   Pulse 93   Temp 98.7 F (37.1 C) (Oral)   Resp 17   Ht 5' 3.5" (1.613 m)   Wt 69.9 kg (154 lb 2 oz)   SpO2 100%   BMI 26.87 kg/m  Results for orders placed or performed during the hospital encounter of 04/04/17 (from the past 24 hour(s))  Basic metabolic panel     Status: Abnormal   Collection Time: 04/05/17  4:36 AM  Result Value Ref Range   Sodium 136 135 - 145 mmol/L   Potassium 3.8 3.5 - 5.1 mmol/L   Chloride 104 101 - 111 mmol/L   CO2 25 22 - 32 mmol/L   Glucose, Bld 132 (H) 65 - 99 mg/dL   BUN 7 6 - 20 mg/dL   Creatinine, Ser 0.70 0.44 - 1.00 mg/dL   Calcium 8.2 (L) 8.9 - 10.3 mg/dL   GFR calc non Af Amer >60 >60 mL/min   GFR calc Af Amer >60 >60 mL/min   Anion gap 7 5 - 15  CBC     Status: Abnormal   Collection Time: 04/05/17  4:36 AM  Result Value Ref Range   WBC 17.1 (H) 4.0 - 10.5 K/uL   RBC 4.07 3.87 - 5.11 MIL/uL   Hemoglobin 12.1 12.0 - 15.0 g/dL   HCT 35.9 (L) 36.0 - 46.0 %   MCV 88.2 78.0 - 100.0 fL   MCH 29.7 26.0 - 34.0 pg   MCHC 33.7 30.0 - 36.0 g/dL   RDW 13.0 11.5 - 15.5 %   Platelets 143 (L) 150 - 400 K/uL   Abdomen is soft flat and non tender and non distended Bandage clean and dry  She was discharged home in good condition Discharge precautions given Rx Norco, Ibuprofen, Robaxin given to patient Follow up in 1 week

## 2017-04-05 NOTE — Progress Notes (Signed)
Discharge instructions reviewed with patient and husband. All questions were answered. Patient accompanied by nurse tech with belongings to vehicle.

## 2017-04-06 ENCOUNTER — Other Ambulatory Visit: Payer: Self-pay | Admitting: *Deleted

## 2017-04-06 ENCOUNTER — Encounter: Payer: Self-pay | Admitting: *Deleted

## 2017-04-06 NOTE — Patient Outreach (Signed)
[facility] Care Management  04/06/2017  Abner Greenspan, MD 1966-03-28 [id number]  Subjective: Telephone call to patient's mobile number, spoke with patient, and HIPAA verified.  Discussed [facility] Care Management [facility] Transition of care follow up, patient voiced understanding, and is in agreement to follow up.   Patient states she is doing well, has been sleeping most of day, recuperating from hospitalization, and catching up on rest.  States she has a follow up appointment with surgeon on 04/11/17.  Patient voices understanding of medical diagnosis, surgery, and treatment plan. [facility] benefits discussed on 04/03/17 preoperative call and patient states no additional questions at this time.  Patient states she does not have any education material, transition of care, care coordination, disease management, disease monitoring, transportation, community resource, or pharmacy needs at this time.  States she is very appreciative of the follow up and is in agreement to receive [place] Management information.    Objective:Per KPN (Knowledge Performance Now, point of care tool) and chart review,patient hospitalized 04/04/17 -04/15/17  for Symptomatic fibroids.  Status post  Total HYSTERECTOMY ABDOMINALandBILATERAL [facility] on 04/04/17.  Patient has a history of migraines.     Assessment: Received [facility] Preoperative / Transition of care referral on 03/23/17.Preoperative call completed, and transition of care follow up completed, and will proceed with case closure.     Plan:RNCM will send patient successful outreach letter, [facility] pamphlet, and magnet. RNCM will send case closure due to follow up completed / no care management needs request to [facility].      Latalia Etzler H. Annia Friendly, BSN, [place] Management [facility] Telephonic CM Phone: [phone] Fax: [phone]

## 2017-05-28 MED FILL — clonazePAM 0.5 MG TABS: 0.5 | 23 days supply | Qty: 45 | Fill #0

## 2017-06-14 MED FILL — MONTELUKAST SOD 10 MG TAB: 10 | 90 days supply | Qty: 90 | Fill #2

## 2017-06-14 MED FILL — BALZIVA 28 TABLET: 0.4-35 | 84 days supply | Qty: 112 | Fill #2

## 2017-07-12 MED FILL — clonazePAM 0.5 MG TABS: 0.5 | 23 days supply | Qty: 45 | Fill #1

## 2017-08-28 MED FILL — clonazePAM 0.5 MG TABS: 0.5 | 23 days supply | Qty: 45 | Fill #2

## 2017-08-30 ENCOUNTER — Other Ambulatory Visit: Payer: Self-pay | Admitting: Occupational Medicine

## 2017-08-31 LAB — CBC WITH DIFFERENTIAL/PLATELET
BASOS ABS: 71 {cells}/uL (ref 0–200)
Basophils Relative: 1 %
EOS ABS: 92 {cells}/uL (ref 15–500)
Eosinophils Relative: 1.3 %
HCT: 37.3 % — ABNORMAL LOW (ref 38.5–45.0)
Hemoglobin: 12.7 g/dL — ABNORMAL LOW (ref 13.2–15.5)
Lymphs Abs: 1626 cells/uL (ref 850–3900)
MCH: 29.6 pg (ref 27.0–33.0)
MCHC: 34 g/dL (ref 32.0–36.0)
MCV: 86.9 fL (ref 80.0–100.0)
MPV: 13.2 fL — ABNORMAL HIGH (ref 7.5–12.5)
Monocytes Relative: 6.6 %
NEUTROS PCT: 68.2 %
Neutro Abs: 4842 cells/uL (ref 1500–7800)
PLATELETS: 156 10*3/uL (ref 140–400)
RBC: 4.29 10*6/uL (ref 4.20–5.10)
RDW: 12.1 % (ref 11.0–15.0)
TOTAL LYMPHOCYTE: 22.9 %
WBC: 7.1 10*3/uL (ref 3.8–10.8)
WBCMIX: 469 {cells}/uL (ref 200–950)

## 2017-08-31 LAB — COMPLETE METABOLIC PANEL WITH GFR
AG RATIO: 1.5 (calc) (ref 1.0–2.5)
ALT: 11 U/L
AST: 13 U/L (ref 10–40)
Albumin: 3.6 g/dL (ref 3.6–5.1)
Alkaline phosphatase (APISO): 42 U/L (ref 33–115)
BUN: 16 mg/dL (ref 7–25)
CHLORIDE: 105 mmol/L (ref 98–110)
CO2: 26 mmol/L (ref 20–32)
Calcium: 8.5 mg/dL — ABNORMAL LOW
Creat: 0.78 mg/dL
GLOBULIN: 2.4 g/dL (ref 1.9–3.7)
GLUCOSE: 85 mg/dL (ref 65–99)
POTASSIUM: 3.9 mmol/L (ref 3.5–5.3)
Sodium: 137 mmol/L (ref 135–146)
Total Bilirubin: 0.4 mg/dL (ref 0.2–1.2)
Total Protein: 6 g/dL — ABNORMAL LOW (ref 6.1–8.1)

## 2017-08-31 LAB — LIPID PANEL
Cholesterol: 163 mg/dL (ref <200)
HDL: 60 mg/dL
LDL Cholesterol (Calc): 80 mg/dL (calc)
Non-HDL Cholesterol (Calc): 103 mg/dL (calc) (ref <130)
TRIGLYCERIDES: 135 mg/dL (ref <150)
Total CHOL/HDL Ratio: 2.7 (calc) (ref <5.0)

## 2017-08-31 LAB — TSH: TSH: 2.1 mIU/L (ref 0.40–4.50)

## 2017-09-10 MED FILL — MONTELUKAST SOD 10 MG TAB: 10 | 90 days supply | Qty: 90 | Fill #3

## 2017-09-12 MED FILL — BALZIVA 28 TABLET: 0.4-35 | 84 days supply | Qty: 112 | Fill #0

## 2017-10-09 MED FILL — clonazePAM 0.5 MG TABS: 0.5 | 23 days supply | Qty: 45 | Fill #0

## 2017-11-13 MED FILL — clonazePAM 0.5 MG TABS: 0.5 | 23 days supply | Qty: 45 | Fill #1

## 2017-11-13 MED FILL — GABAPENTIN 600 MG TAB: 600 | 90 days supply | Qty: 270 | Fill #1

## 2017-12-03 MED FILL — BALZIVA 28 TABLET: 0.4-35 | 84 days supply | Qty: 112 | Fill #1

## 2017-12-13 DIAGNOSIS — Z1231 Encounter for screening mammogram for malignant neoplasm of breast: Secondary | ICD-10-CM | POA: Diagnosis not present

## 2017-12-13 DIAGNOSIS — Z01419 Encounter for gynecological examination (general) (routine) without abnormal findings: Secondary | ICD-10-CM | POA: Diagnosis not present

## 2017-12-13 DIAGNOSIS — Z6827 Body mass index (BMI) 27.0-27.9, adult: Secondary | ICD-10-CM | POA: Diagnosis not present

## 2017-12-13 MED FILL — METHOCARBAMOL 500 MG TABS: 500 | 23 days supply | Qty: 90 | Fill #0

## 2017-12-13 MED FILL — MONTELUKAST SOD 10 MG TAB: 10 | 90 days supply | Qty: 90 | Fill #0

## 2017-12-13 MED FILL — buPROPion HCL ER (XL) 150 M: 150 | 90 days supply | Qty: 90 | Fill #0

## 2017-12-28 LAB — HM DIABETES EYE EXAM

## 2018-01-02 ENCOUNTER — Encounter: Payer: Self-pay | Admitting: Endocrinology

## 2018-01-02 MED FILL — clonazePAM 0.5 MG TABS: 0.5 | 23 days supply | Qty: 45 | Fill #2

## 2018-02-06 ENCOUNTER — Telehealth: Payer: Self-pay | Admitting: Family Medicine

## 2018-02-06 MED ORDER — SCOPOLAMINE 1 MG/3DAYS TD PT72: 1.0000 | MEDICATED_PATCH | TRANSDERMAL | 1 refills | Status: DC

## 2018-02-06 MED FILL — SCOPOLAMINE 1 MG/3DAYS PT72: 1 | 12 days supply | Qty: 4 | Fill #0

## 2018-02-06 NOTE — Telephone Encounter (Signed)
Madison Nguyen requests scopolamine patch for upcoming trip on boat.

## 2018-02-12 MED FILL — clonazePAM 0.5 MG TABS: 0.5 | 23 days supply | Qty: 45 | Fill #3

## 2018-02-25 MED FILL — BRIELLYN TABLET: 0.4-35 | 84 days supply | Qty: 112 | Fill #0

## 2018-03-07 DIAGNOSIS — G4700 Insomnia, unspecified: Secondary | ICD-10-CM | POA: Diagnosis not present

## 2018-03-07 DIAGNOSIS — G43009 Migraine without aura, not intractable, without status migrainosus: Secondary | ICD-10-CM | POA: Diagnosis not present

## 2018-03-07 MED FILL — GABAPENTIN 600 MG TABLET: 600 | 90 days supply | Qty: 270 | Fill #0

## 2018-03-07 MED FILL — clonazePAM 0.5 MG TABS: 0.5 | 22 days supply | Qty: 45 | Fill #0

## 2018-03-11 MED FILL — buPROPion HCL ER (XL) 150 M: 150 | 90 days supply | Qty: 90 | Fill #1

## 2018-03-11 MED FILL — MONTELUKAST SOD 10 MG TAB: 10 | 90 days supply | Qty: 90 | Fill #1

## 2018-05-09 MED FILL — LINZESS 145 MCG CAPSULE: 145 | 90 days supply | Qty: 90 | Fill #0

## 2018-05-09 MED FILL — clonazePAM 0.5 MG TABS: 0.5 | 22 days supply | Qty: 45 | Fill #1

## 2018-05-20 MED FILL — BRIELLYN TABLET: 0.4-35 | 84 days supply | Qty: 112 | Fill #1

## 2018-06-11 MED FILL — MONTELUKAST SOD 10 MG TAB: 10 | 90 days supply | Qty: 90 | Fill #2

## 2018-06-12 ENCOUNTER — Telehealth (INDEPENDENT_AMBULATORY_CARE_PROVIDER_SITE_OTHER): Payer: 59 | Admitting: Family Medicine

## 2018-06-12 ENCOUNTER — Ambulatory Visit (INDEPENDENT_AMBULATORY_CARE_PROVIDER_SITE_OTHER)
Admission: RE | Admit: 2018-06-12 | Discharge: 2018-06-12 | Disposition: A | Discharge status: Home / Self Care | Payer: 59 | Source: Ambulatory Visit | Attending: Family Medicine | Admitting: Family Medicine

## 2018-06-12 DIAGNOSIS — R05 Cough: Secondary | ICD-10-CM

## 2018-06-12 DIAGNOSIS — R509 Fever, unspecified: Secondary | ICD-10-CM

## 2018-06-12 DIAGNOSIS — R059 Cough, unspecified: Secondary | ICD-10-CM

## 2018-06-12 LAB — POC INFLUENZA A&B (BINAX/QUICKVUE)
Influenza A, POC: POSITIVE — AB
Influenza B, POC: NEGATIVE

## 2018-06-12 NOTE — Addendum Note (Signed)
Addended by: Brenton Grills on: 01/08/7516 00:17 PM   Modules accepted: Orders

## 2018-06-12 NOTE — Telephone Encounter (Signed)
Fever, congestion, cough, flu like symptoms ongoing for a week. Will check CXR.

## 2018-06-12 NOTE — Telephone Encounter (Signed)
Vitals: T:  98.7 Ox:  99 P: 100 BP:  122/78

## 2018-06-14 MED ORDER — DOXYCYCLINE HYCLATE 100 MG PO TABS: 100.0000 mg | ORAL_TABLET | Freq: Two times a day (BID) | ORAL | 0 refills | Status: DC

## 2018-06-14 MED FILL — DOXYCYCLINE HYCLATE 100 MG: 100 | 10 days supply | Qty: 20 | Fill #0

## 2018-06-14 NOTE — Addendum Note (Signed)
Addended by: Ria Bush on: 06/14/2018 11:05 AM   Modules accepted: Orders

## 2018-06-14 NOTE — Telephone Encounter (Addendum)
Now with sinusitis symptoms of unilateral facial pain, tooth pain, Will Rx doxy

## 2018-06-18 MED FILL — buPROPion HCL ER (XL) 150 M: 150 | 90 days supply | Qty: 90 | Fill #2

## 2018-06-24 MED FILL — clonazePAM 0.5 MG TABS: 0.5 | 22 days supply | Qty: 45 | Fill #2

## 2018-07-02 IMAGING — DX DG KNEE AP/LAT W/ SUNRISE*L*
4 series · 4 of 4 positions shown · non-contrast
Comparison: 01/25/2010

CLINICAL DATA: Left knee pain.  No known injury.

EXAM:
LEFT KNEE 3 VIEWS

[knee ap]
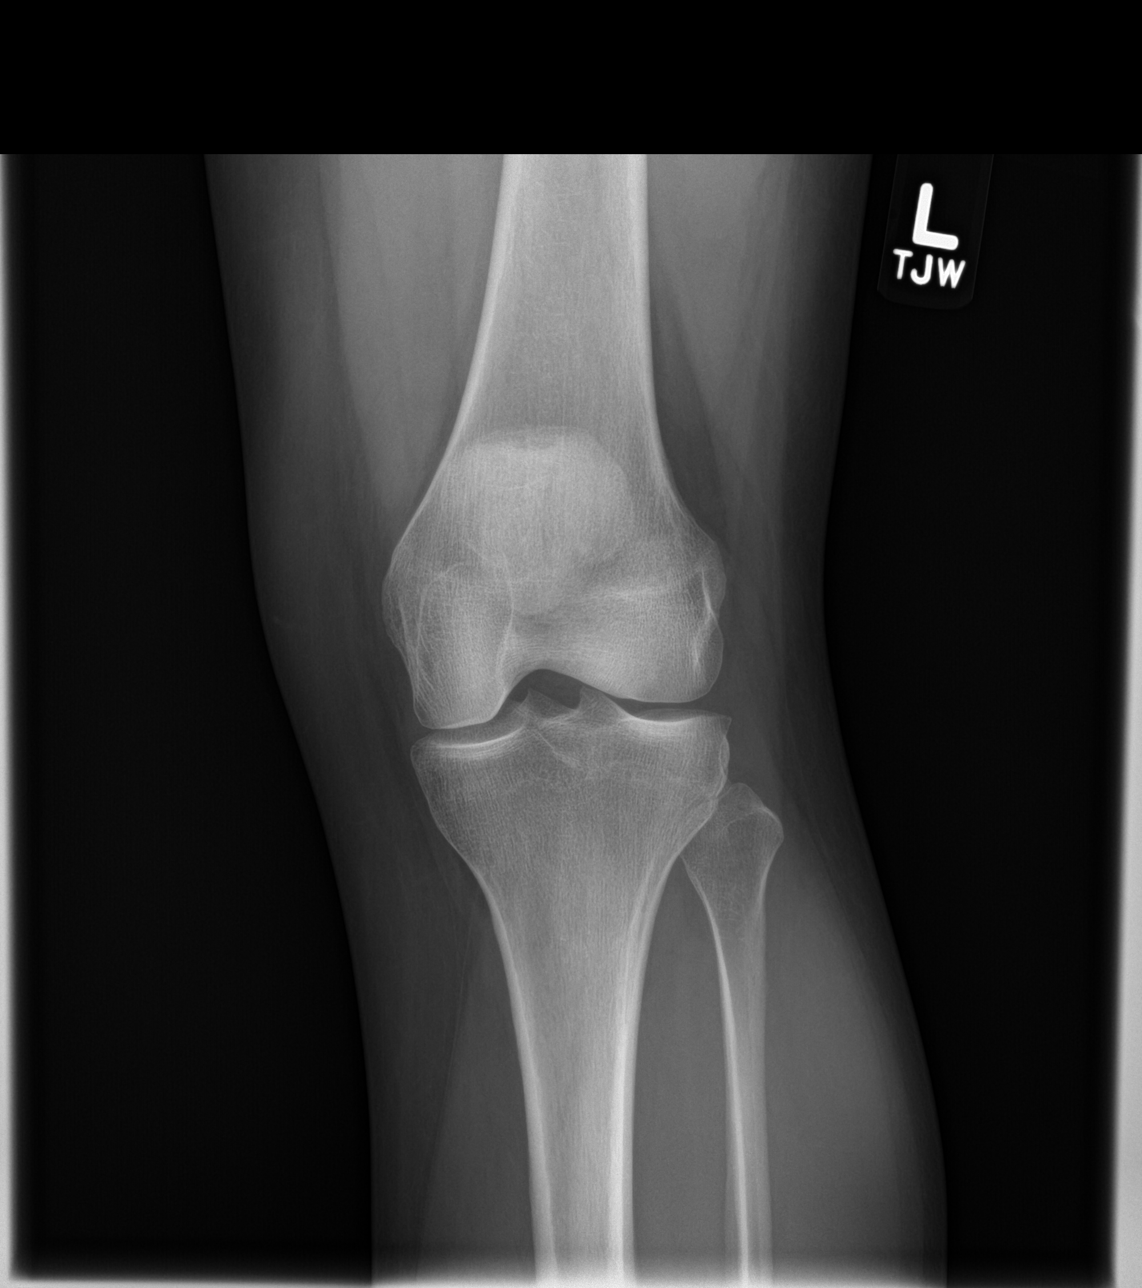

[knee lat]
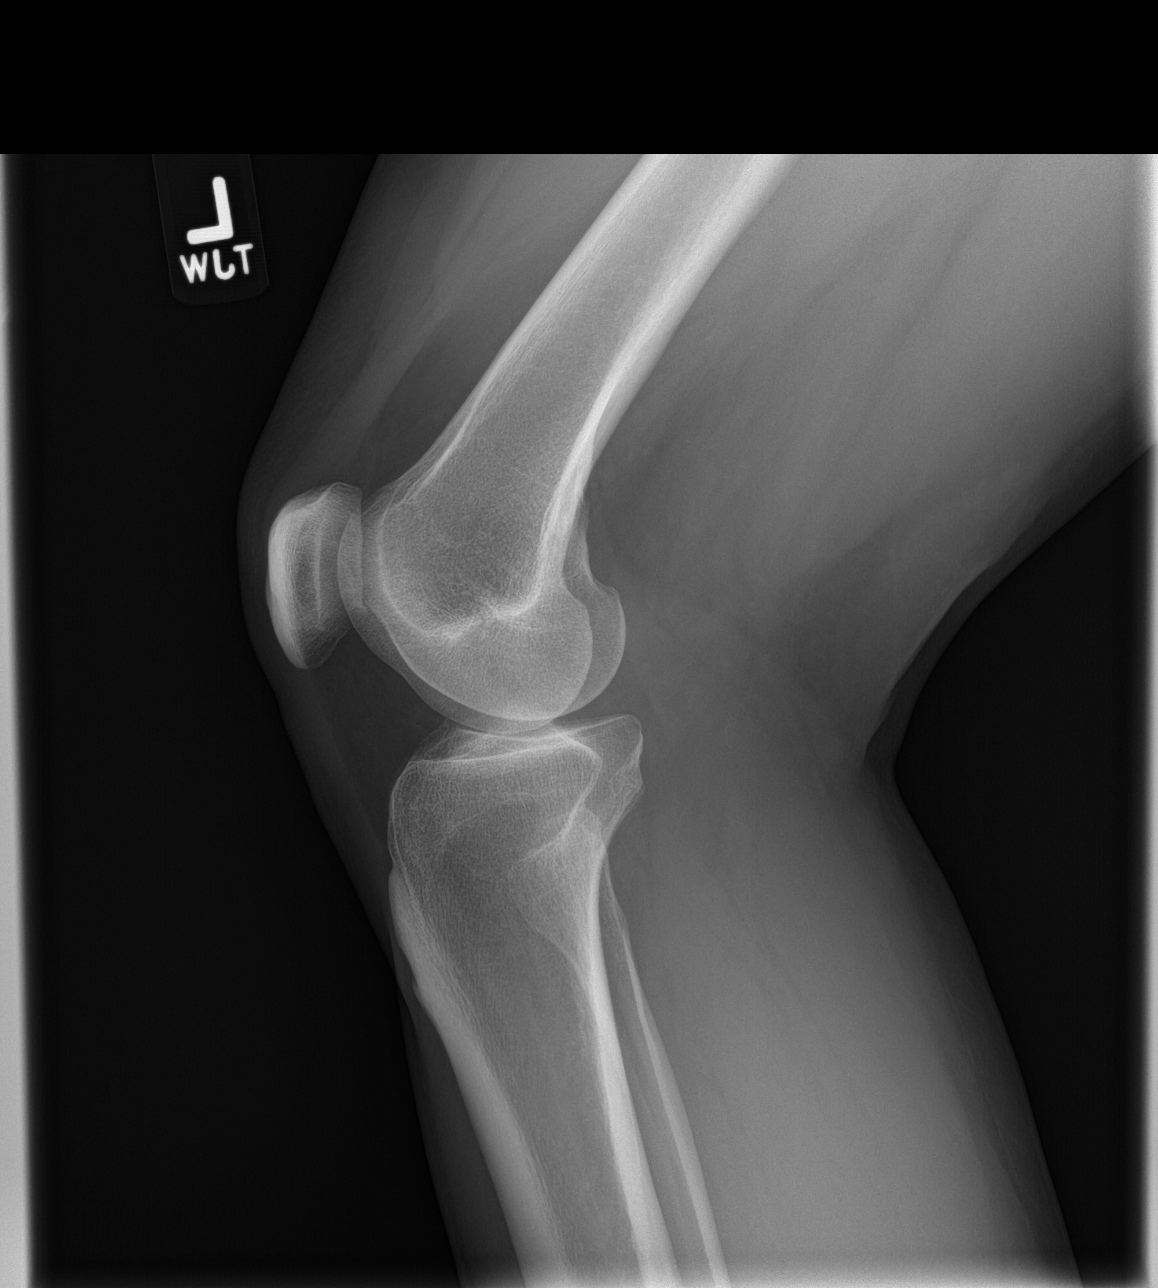

[patella skyline]
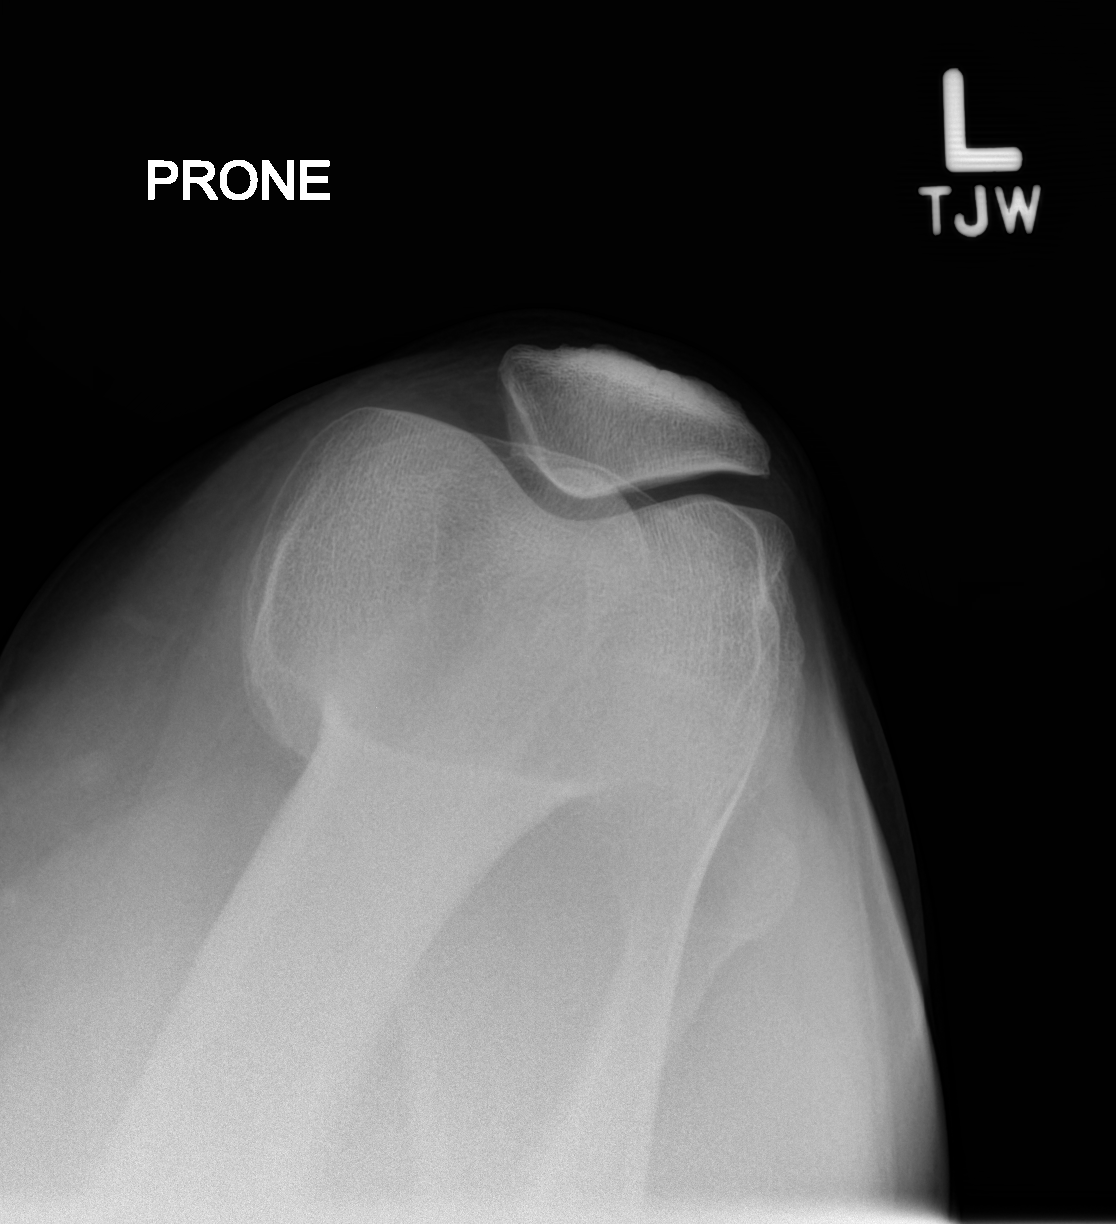

[knee obl]
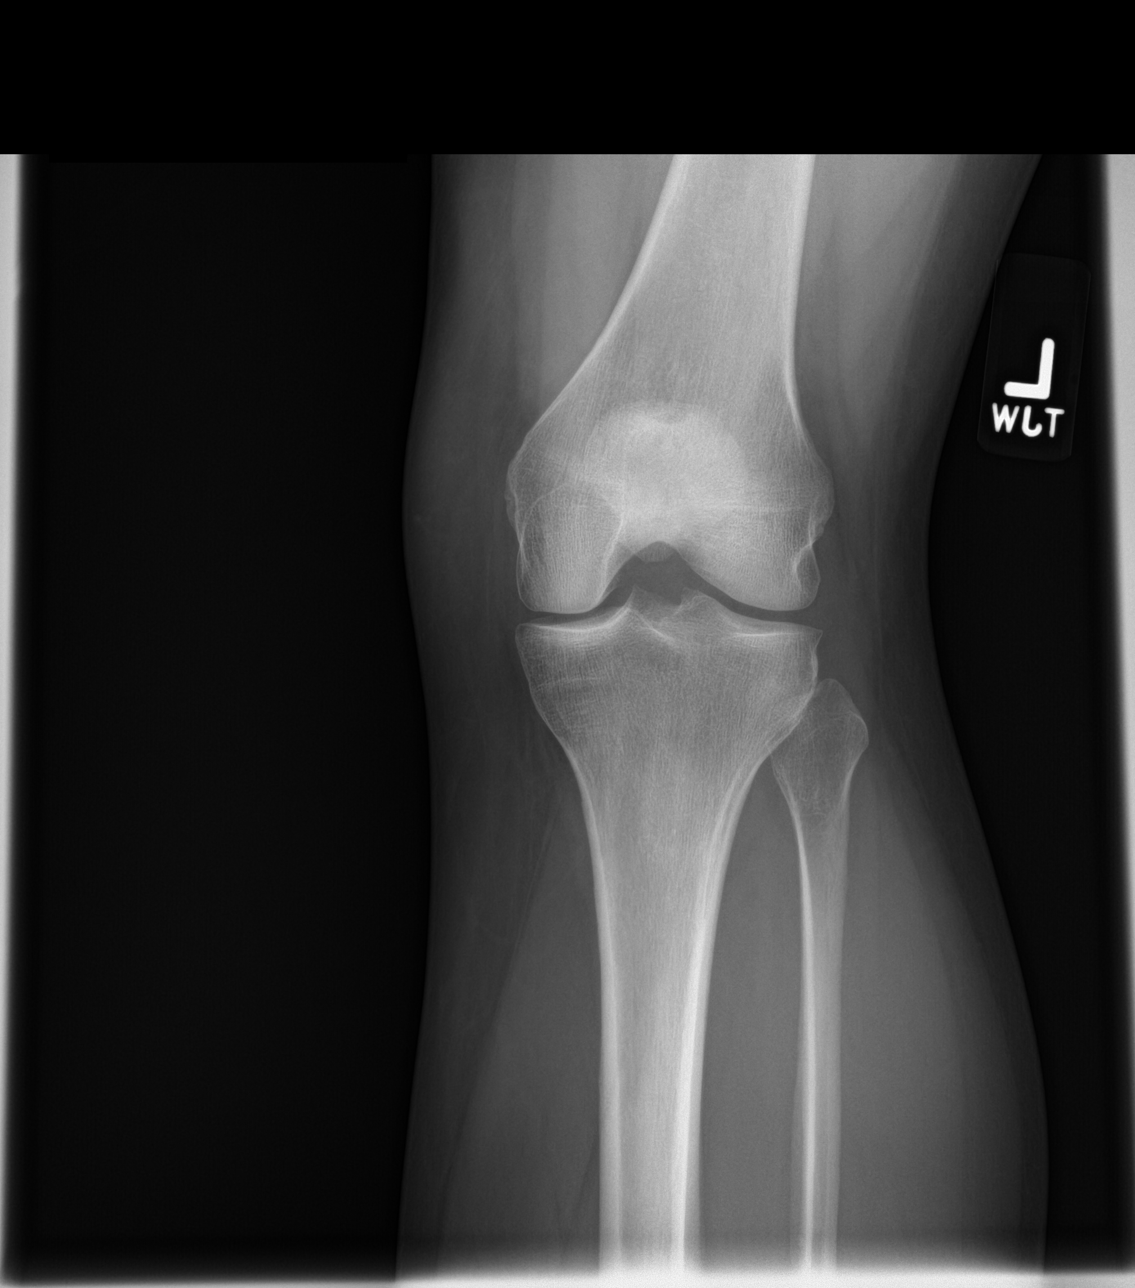

[4 of 4 positions shown; findings below may reference images not displayed]

FINDINGS: No evidence of fracture, dislocation, or joint effusion. No evidence
of arthropathy or other focal bone abnormality. Soft tissues are
unremarkable.
IMPRESSION: Negative.

## 2018-07-26 MED ORDER — AZITHROMYCIN 250 MG PO TABS: ORAL_TABLET | ORAL | 0 refills | Status: DC

## 2018-07-26 MED ORDER — ALBUTEROL SULFATE HFA 108 (90 BASE) MCG/ACT IN AERS: 2.0000 | INHALATION_SPRAY | Freq: Four times a day (QID) | RESPIRATORY_TRACT | 6 refills | Status: DC | PRN

## 2018-07-26 MED FILL — VENTOLIN HFA 90 MCG INHALER: 108 (90 BAS | 25 days supply | Qty: 18 | Fill #0

## 2018-07-26 MED FILL — AZITHROMYCIN 250 MG TABS: 250 | 5 days supply | Qty: 6 | Fill #0

## 2018-07-26 NOTE — Addendum Note (Signed)
Addended by: Ria Bush on: 07/26/2018 01:44 PM   Modules accepted: Orders

## 2018-07-26 NOTE — Telephone Encounter (Signed)
Off and on illness over the past 2 months.  Initial influenza then sinusitis, treated with doxy course mid January.  Had another 2 viral illnesses this month, never fully resolving, latest for last 9-10 days with persistent productive cough, congestion. Lungs sound coarse and rhonchorous throughout.  Will refill albuterol, provide with WASP for zpack over weekend.

## 2018-07-26 NOTE — Addendum Note (Signed)
Addended by: Ria Bush on: 07/26/2018 01:45 PM   Modules accepted: Orders

## 2018-08-06 MED FILL — clonazePAM 0.5 MG TABS: 0.5 | 22 days supply | Qty: 45 | Fill #3

## 2018-08-13 MED FILL — BRIELLYN TABLET: 0.4-35 | 84 days supply | Qty: 112 | Fill #2 | Status: TO

## 2018-09-02 MED FILL — GABAPENTIN 600 MG TABLET: 600 | 90 days supply | Qty: 270 | Fill #0

## 2018-09-02 MED FILL — MONTELUKAST SOD 10 MG TAB: 10 | 90 days supply | Qty: 90 | Fill #0

## 2018-09-02 MED FILL — buPROPion HCL ER (XL) 150 M: 150 | 90 days supply | Qty: 90 | Fill #0

## 2018-09-18 MED FILL — clonazePAM 0.5 MG TABS: 0.5 | 23 days supply | Qty: 45 | Fill #0

## 2018-11-01 MED FILL — BRIELLYN TABLET: 0.4-35 | 84 days supply | Qty: 112 | Fill #0

## 2018-11-06 MED FILL — clonazePAM 0.5 MG TABS: 0.5 | 23 days supply | Qty: 45 | Fill #1

## 2018-12-02 MED FILL — MONTELUKAST SOD 10 MG TAB: 10 | 90 days supply | Qty: 90 | Fill #0

## 2018-12-02 MED FILL — buPROPion HCL ER (XL) 150 M: 150 | 90 days supply | Qty: 90 | Fill #0

## 2018-12-18 MED FILL — clonazePAM 0.5 MG TABS: 0.5 | 23 days supply | Qty: 45 | Fill #2

## 2019-01-02 DIAGNOSIS — Z6825 Body mass index (BMI) 25.0-25.9, adult: Secondary | ICD-10-CM | POA: Diagnosis not present

## 2019-01-02 DIAGNOSIS — Z1231 Encounter for screening mammogram for malignant neoplasm of breast: Secondary | ICD-10-CM | POA: Diagnosis not present

## 2019-01-02 DIAGNOSIS — Z01419 Encounter for gynecological examination (general) (routine) without abnormal findings: Secondary | ICD-10-CM | POA: Diagnosis not present

## 2019-01-02 MED FILL — LINZESS 145 MCG CAPSULE: 145 | 90 days supply | Qty: 90 | Fill #0

## 2019-01-07 MED FILL — BRIELLYN TABLET: 0.4-35 | 84 days supply | Qty: 84 | Fill #0

## 2019-01-29 MED FILL — clonazePAM 0.5 MG TABS: 0.5 | 22 days supply | Qty: 45 | Fill #0

## 2019-02-25 ENCOUNTER — Other Ambulatory Visit: Payer: Self-pay | Admitting: Occupational Medicine

## 2019-02-26 LAB — CBC WITH DIFFERENTIAL/PLATELET
Absolute Monocytes: 450 cells/uL (ref 200–950)
Basophils Absolute: 60 cells/uL (ref 0–200)
Basophils Relative: 0.8 %
Eosinophils Absolute: 68 cells/uL (ref 15–500)
Eosinophils Relative: 0.9 %
HCT: 42.1 % (ref 35.0–45.0)
Hemoglobin: 14.2 g/dL (ref 11.7–15.5)
Lymphs Abs: 1155 cells/uL (ref 850–3900)
MCH: 30.3 pg (ref 27.0–33.0)
MCHC: 33.7 g/dL (ref 32.0–36.0)
MCV: 89.8 fL (ref 80.0–100.0)
MPV: 13.7 fL — ABNORMAL HIGH (ref 7.5–12.5)
Monocytes Relative: 6 %
Neutro Abs: 5768 cells/uL (ref 1500–7800)
Neutrophils Relative %: 76.9 %
Platelets: 172 10*3/uL (ref 140–400)
RBC: 4.69 10*6/uL (ref 3.80–5.10)
RDW: 12.1 % (ref 11.0–15.0)
Total Lymphocyte: 15.4 %
WBC: 7.5 10*3/uL (ref 3.8–10.8)

## 2019-02-26 LAB — COMPLETE METABOLIC PANEL WITH GFR
AG Ratio: 1.6 (calc) (ref 1.0–2.5)
ALT: 14 U/L (ref 6–29)
AST: 17 U/L (ref 10–35)
Albumin: 4 g/dL (ref 3.6–5.1)
Alkaline phosphatase (APISO): 45 U/L (ref 37–153)
BUN: 11 mg/dL (ref 7–25)
CO2: 20 mmol/L (ref 20–32)
Calcium: 9.4 mg/dL (ref 8.6–10.4)
Chloride: 104 mmol/L (ref 98–110)
Creat: 1 mg/dL (ref 0.50–1.05)
GFR, Est African American: 76 mL/min/{1.73_m2} (ref >=60)
GFR, Est Non African American: 65 mL/min/{1.73_m2} (ref >=60)
Globulin: 2.5 g/dL (calc) (ref 1.9–3.7)
Glucose, Bld: 83 mg/dL (ref 65–139)
Potassium: 4 mmol/L (ref 3.5–5.3)
Sodium: 136 mmol/L (ref 135–146)
Total Bilirubin: 0.6 mg/dL (ref 0.2–1.2)
Total Protein: 6.5 g/dL (ref 6.1–8.1)

## 2019-02-26 LAB — LIPID PANEL
Cholesterol: 174 mg/dL (ref <200)
HDL: 69 mg/dL (ref >=50)
LDL Cholesterol (Calc): 83 mg/dL (calc)
Non-HDL Cholesterol (Calc): 105 mg/dL (calc) (ref <130)
Total CHOL/HDL Ratio: 2.5 (calc) (ref <5.0)
Triglycerides: 128 mg/dL (ref <150)

## 2019-02-26 LAB — TSH: TSH: 1.62 mIU/L

## 2019-03-06 MED FILL — buPROPion HCL ER (XL) 150 M: 150 | 90 days supply | Qty: 90 | Fill #0

## 2019-03-12 MED FILL — clonazePAM 0.5 MG TABS: 0.5 | 22 days supply | Qty: 45 | Fill #1

## 2019-04-01 MED FILL — BRIELLYN TABLET: 0.4-35 | 84 days supply | Qty: 84 | Fill #1

## 2019-04-02 DIAGNOSIS — G43009 Migraine without aura, not intractable, without status migrainosus: Secondary | ICD-10-CM | POA: Diagnosis not present

## 2019-04-02 MED FILL — clonazePAM 0.5 MG TABS: 0.5 | 90 days supply | Qty: 180 | Fill #0

## 2019-04-02 MED FILL — RIZATRIPTAN BENZOATE 10 MG: 10 | 60 days supply | Qty: 36 | Fill #0

## 2019-04-02 MED FILL — GABAPENTIN 600 MG TABLET: 600 | 90 days supply | Qty: 270 | Fill #0

## 2019-05-18 DIAGNOSIS — H5213 Myopia, bilateral: Secondary | ICD-10-CM | POA: Diagnosis not present

## 2019-06-03 MED FILL — MONTELUKAST SOD 10 MG TAB: 10 | 90 days supply | Qty: 90 | Fill #1

## 2019-06-04 MED FILL — BUPROPION HCL XL 150 MG TAB: 150 | 90 days supply | Qty: 90 | Fill #1

## 2019-06-05 MED FILL — BRIELLYN TABLET: 0.4-35 | 63 days supply | Qty: 84 | Fill #0

## 2019-08-03 MED FILL — BRIELLYN TABLET: 0.4-35 | 63 days supply | Qty: 84 | Fill #1

## 2019-09-01 MED FILL — buPROPion HCL ER (XL) 150 M: 150 | 90 days supply | Qty: 90 | Fill #2

## 2019-09-01 MED FILL — MONTELUKAST SOD 10 MG TAB: 10 | 90 days supply | Qty: 90 | Fill #2

## 2019-09-19 MED FILL — clonazePAM 0.5 MG TABS: 0.5 | 90 days supply | Qty: 180 | Fill #1

## 2019-10-28 MED FILL — BRIELLYN TABLET: 0.4-35 | 84 days supply | Qty: 112 | Fill #2

## 2019-11-06 ENCOUNTER — Other Ambulatory Visit: Payer: Self-pay | Admitting: Family Medicine

## 2019-11-06 MED ORDER — SCOPOLAMINE 1 MG/3DAYS TD PT72: 1.0000 | MEDICATED_PATCH | TRANSDERMAL | 1 refills | Status: DC

## 2019-11-24 MED FILL — CLINDAMYCIN HCL 150 MG CAPS: 150 | 10 days supply | Qty: 30 | Fill #0

## 2019-12-01 MED FILL — buPROPion HCL ER (XL) 150 M: 150 | 90 days supply | Qty: 90 | Fill #3

## 2019-12-02 MED FILL — MONTELUKAST SOD 10 MG TAB: 10 | 90 days supply | Qty: 90 | Fill #3

## 2019-12-04 MED FILL — traMADol HCL 50 MG TABS: 50 | 5 days supply | Qty: 20 | Fill #0

## 2020-01-19 MED FILL — BRIELLYN TABLET: 0.4-35 | 21 days supply | Qty: 28 | Fill #3

## 2020-02-17 ENCOUNTER — Other Ambulatory Visit (HOSPITAL_COMMUNITY): Payer: Self-pay | Admitting: Radiology

## 2020-02-17 DIAGNOSIS — Z90711 Acquired absence of uterus with remaining cervical stump: Secondary | ICD-10-CM | POA: Diagnosis not present

## 2020-02-17 DIAGNOSIS — Z01419 Encounter for gynecological examination (general) (routine) without abnormal findings: Secondary | ICD-10-CM | POA: Diagnosis not present

## 2020-02-17 DIAGNOSIS — Z6825 Body mass index (BMI) 25.0-25.9, adult: Secondary | ICD-10-CM | POA: Diagnosis not present

## 2020-02-17 DIAGNOSIS — Z733 Stress, not elsewhere classified: Secondary | ICD-10-CM | POA: Diagnosis not present

## 2020-02-17 DIAGNOSIS — Z760 Encounter for issue of repeat prescription: Secondary | ICD-10-CM | POA: Diagnosis not present

## 2020-02-17 MED FILL — ESTROGEL 0.06% GEL: 0.75 MG/1.2 | 30 days supply | Qty: 50 | Fill #0

## 2020-02-17 MED FILL — PROGESTERONE 100 MG CAPS: 100 | 90 days supply | Qty: 90 | Fill #0

## 2020-03-01 MED FILL — MONTELUKAST SOD 10 MG TAB: 10 | 90 days supply | Qty: 90 | Fill #0

## 2020-03-03 ENCOUNTER — Telehealth: Payer: Self-pay | Admitting: Family Medicine

## 2020-03-03 DIAGNOSIS — G43909 Migraine, unspecified, not intractable, without status migrainosus: Secondary | ICD-10-CM

## 2020-03-03 NOTE — Telephone Encounter (Signed)
Worsening migraine headaches in setting of menopausal hormonal changes not controlled on maxalt and gabapentin.  Previous headache doc has moved out of [place].  Requests referral to Dr Jaynee Eagles at [facility]. Referral placed.

## 2020-03-04 ENCOUNTER — Other Ambulatory Visit (HOSPITAL_COMMUNITY): Payer: Self-pay | Admitting: Obstetrics and Gynecology

## 2020-03-04 MED FILL — BRIELLYN TABLET: 0.4-35 | 84 days supply | Qty: 112 | Fill #0

## 2020-03-08 MED FILL — buPROPion HCL ER (XL) 150 M: 150 | 90 days supply | Qty: 90 | Fill #0

## 2020-03-25 ENCOUNTER — Encounter: Payer: Self-pay | Admitting: Neurology

## 2020-03-25 ENCOUNTER — Other Ambulatory Visit: Payer: Self-pay

## 2020-03-25 ENCOUNTER — Ambulatory Visit: Payer: 59 | Admitting: Neurology

## 2020-03-25 VITALS — BP 114/73 | HR 92 | Ht 63.5 in | Wt 144.0 lb

## 2020-03-25 DIAGNOSIS — G43709 Chronic migraine without aura, not intractable, without status migrainosus: Secondary | ICD-10-CM | POA: Diagnosis not present

## 2020-03-25 MED ORDER — AJOVY 225 MG/1.5ML ~~LOC~~ SOAJ: 225.0000 mg | SUBCUTANEOUS | 0 refills | Status: DC

## 2020-03-25 MED ORDER — UBRELVY 100 MG PO TABS: 100.0000 mg | ORAL_TABLET | ORAL | 0 refills | Status: DC | PRN

## 2020-03-25 MED ORDER — REYVOW 100 MG PO TABS: 100.0000 mg | ORAL_TABLET | Freq: Every evening | ORAL | 0 refills | Status: DC | PRN

## 2020-03-25 NOTE — Patient Instructions (Addendum)
OnabotulinumtoxinA injection (Medical Use) What is this medicine? ONABOTULINUMTOXINA (o na BOTT you lye num tox in eh) is a neuro-muscular blocker. This medicine is used to treat crossed eyes, eyelid spasms, severe neck muscle spasms, ankle and toe muscle spasms, and elbow, wrist, and finger muscle spasms. It is also used to treat excessive underarm sweating, to prevent chronic migraine headaches, and to treat loss of bladder control due to neurologic conditions such as multiple sclerosis or spinal cord injury. This medicine may be used for other purposes; ask your health care provider or pharmacist if you have questions. COMMON BRAND NAME(S): Botox What should I tell my health care provider before I take this medicine? They need to know if you have any of these conditions:  breathing problems  cerebral palsy spasms  difficulty urinating  heart problems  history of surgery where this medicine is going to be used  infection at the site where this medicine is going to be used  myasthenia gravis or other neurologic disease  nerve or muscle disease  surgery plans  take medicines that treat or prevent blood clots  thyroid problems  an unusual or allergic reaction to botulinum toxin, albumin, other medicines, foods, dyes, or preservatives  pregnant or trying to get pregnant  breast-feeding How should I use this medicine? This medicine is for injection into a muscle. It is given by a health care professional in a hospital or clinic setting. Talk to your pediatrician regarding the use of this medicine in children. While this drug may be prescribed for children as young as 11 years old for selected conditions, precautions do apply. Overdosage: If you think you have taken too much of this medicine contact a poison control center or emergency room at once. NOTE: This medicine is only for you. Do not share this medicine with others. What if I miss a dose? This does not apply. What may  interact with this medicine?  aminoglycoside antibiotics like gentamicin, neomycin, tobramycin  muscle relaxants  other botulinum toxin injections This list may not describe all possible interactions. Give your health care provider a list of all the medicines, herbs, non-prescription drugs, or dietary supplements you use. Also tell them if you smoke, drink alcohol, or use illegal drugs. Some items may interact with your medicine. What should I watch for while using this medicine? Visit your doctor for regular check ups. This medicine will cause weakness in the muscle where it is injected. Tell your doctor if you feel unusually weak in other muscles. Get medical help right away if you have problems with breathing, swallowing, or talking. This medicine might make your eyelids droop or make you see blurry or double. If you have weak muscles or trouble seeing do not drive a car, use machinery, or do other dangerous activities. This medicine contains albumin from human blood. It may be possible to pass an infection in this medicine, but no cases have been reported. Talk to your doctor about the risks and benefits of this medicine. If your activities have been limited by your condition, go back to your regular routine slowly after treatment with this medicine. What side effects may I notice from receiving this medicine? Side effects that you should report to your doctor or health care professional as soon as possible:  allergic reactions like skin rash, itching or hives, swelling of the face, lips, or tongue  breathing problems  changes in vision  chest pain or tightness  eye irritation, pain  fast, irregular heartbeat  infection  numbness  speech problems  swallowing problems  unusual weakness Side effects that usually do not require medical attention (report to your doctor or health care professional if they continue or are bothersome):  bruising or pain at site where  injected  drooping eyelid  dry eyes or mouth  headache  muscles aches, pains  sensitivity to light  tearing This list may not describe all possible side effects. Call your doctor for medical advice about side effects. You may report side effects to FDA at 1-800-FDA-1088. Where should I keep my medicine? This drug is given in a hospital or clinic and will not be stored at home. NOTE: This sheet is a summary. It may not cover all possible information. If you have questions about this medicine, talk to your doctor, pharmacist, or health care provider.  2020 Elsevier/Gold Standard (2017-11-19 14:21:42)  Rolanda Lundborg injection What is this medicine? FREMANEZUMAB (fre ma NEZ ue mab) is used to prevent migraine headaches. This medicine may be used for other purposes; ask your health care provider or pharmacist if you have questions. COMMON BRAND NAME(S): AJOVY What should I tell my health care provider before I take this medicine? They need to know if you have any of these conditions:  an unusual or allergic reaction to fremanezumab, other medicines, foods, dyes, or preservatives  pregnant or trying to get pregnant  breast-feeding How should I use this medicine? This medicine is for injection under the skin. You will be taught how to prepare and give this medicine. Use exactly as directed. Take your medicine at regular intervals. Do not take your medicine more often than directed. It is important that you put your used needles and syringes in a special sharps container. Do not put them in a trash can. If you do not have a sharps container, call your pharmacist or healthcare provider to get one. Talk to your pediatrician regarding the use of this medicine in children. Special care may be needed. Overdosage: If you think you have taken too much of this medicine contact a poison control center or emergency room at once. NOTE: This medicine is only for you. Do not share this medicine with  others. What if I miss a dose? If you miss a dose, take it as soon as you can. If it is almost time for your next dose, take only that dose. Do not take double or extra doses. What may interact with this medicine? Interactions are not expected. This list may not describe all possible interactions. Give your health care provider a list of all the medicines, herbs, non-prescription drugs, or dietary supplements you use. Also tell them if you smoke, drink alcohol, or use illegal drugs. Some items may interact with your medicine. What should I watch for while using this medicine? Tell your doctor or healthcare professional if your symptoms do not start to get better or if they get worse. What side effects may I notice from receiving this medicine? Side effects that you should report to your doctor or health care professional as soon as possible:  allergic reactions like skin rash, itching or hives, swelling of the face, lips, or tongue Side effects that usually do not require medical attention (report these to your doctor or health care professional if they continue or are bothersome):  pain, redness, or irritation at site where injected This list may not describe all possible side effects. Call your doctor for medical advice about side effects. You may report side effects to FDA  at 1-800-FDA-1088. Where should I keep my medicine? Keep out of the reach of children. You will be instructed on how to store this medicine. Throw away any unused medicine after the expiration date on the label. NOTE: This sheet is a summary. It may not cover all possible information. If you have questions about this medicine, talk to your doctor, pharmacist, or health care provider.  2020 Elsevier/Gold Standard (2017-02-12 17:22:56)   Lasmiditan tablets What is this medicine? LASMIDITAN (las MID i tan) is used to treat migraines with or without aura. An aura is a strange feeling or visual disturbance that warns you of  an attack. It is not used to prevent migraines. This medicine may be used for other purposes; ask your health care provider or pharmacist if you have questions. COMMON BRAND NAME(S): REYVOW What should I tell my health care provider before I take this medicine? They need to know if you have any of these conditions:  heart disease  liver disease  an unusual or allergic reaction to lasmiditan, other medicines, foods, dyes, or preservatives  pregnant or trying to get pregnant  breast-feeding How should I use this medicine? Take this medicine by mouth with a glass of water. Follow the directions on the prescription label. You can take it with or without food. If it upsets your stomach, take it with food. Take your medicine at regular intervals. Do not take it more often than directed. Do not stop taking except on your doctor's advice. Talk to your pediatrician about the use of this medicine in children. Special care may be needed. Overdosage: If you think you have taken too much of this medicine contact a poison control center or emergency room at once. NOTE: This medicine is only for you. Do not share this medicine with others. What if I miss a dose? This does not apply. What may interact with this medicine? This medicine may interact with the following medications:  alcohol  antihistamines for allergy, cough, and cold  certain medicines for anxiety or sleep  certain medicines for blood pressure, heart disease, irregular heart beat  certain medicines for depression, anxiety, or psychotic disorders  certain medicines for seizures like phenobarbital, primidone  dextromethorphan  general anesthetics like halothane, isoflurane, methoxyflurane, propofol  local anesthetics like lidocaine, pramoxine, tetracaine  medicines that relax muscles for surgery  narcotic medicines for pain  phenothiazines like chlorpromazine, mesoridazine, prochlorperazine, thioridazine  St. John's  wort This list may not describe all possible interactions. Give your health care provider a list of all the medicines, herbs, non-prescription drugs, or dietary supplements you use. Also tell them if you smoke, drink alcohol, or use illegal drugs. Some items may interact with your medicine. What should I watch for while using this medicine? Visit your healthcare professional for regular checks on your progress. Tell your healthcare professional if your symptoms do not start to get better or if they get worse. You may get drowsy or dizzy. Do not drive, use machinery, or do anything that needs mental alertness until you know how this medicine affects you. Do not stand up or sit up quickly, especially if you are an older patient. This reduces the risk of dizzy or fainting spells. Alcohol may interfere with the effect of this medicine. Avoid alcoholic drinks. Tell your healthcare professional right away if you have any change in your eyesight. What side effects may I notice from receiving this medicine? Side effects that you should report to your doctor or health care professional  as soon as possible:  allergic reactions like skin rash, itching or hives; swelling of the face, lips, or tongue  changes in vision  signs and symptoms of serotonin syndrome like irritable; confusion; diarrhea; fast or irregular heartbeat; muscle twitching; stiff muscles; trouble walking; sweating; high fever; seizures; chills; vomiting Side effects that usually do not require medical attention (report these to your doctor or health care professional if they continue or are bothersome):  dizziness  drowsiness  headache  nausea, vomiting  palpitations  tiredness This list may not describe all possible side effects. Call your doctor for medical advice about side effects. You may report side effects to FDA at 1-800-FDA-1088. Where should I keep my medicine? Keep out of the reach of children. This medicine can be  abused. Keep your medicine in a safe place to protect it from theft. Do not share this medicine with anyone. Selling or giving away this medicine is dangerous and against the law. Follow the directions in the [place]. Store at room temperature between 15 and 30 degrees C (59 and 86 degrees F). This medicine may cause harm and death if it is taken by other adults, children, or pets. Return medicine that has not been used to an official disposal site. Contact the DEA at 9010483103 or your city/county government to find a site. If you cannot return the medicine, mix any unused medicine with a substance like cat litter or coffee grounds. Then throw the medicine away in a sealed container like a sealed bag or coffee can with a lid. Do not use the medicine after the expiration date. NOTE: This sheet is a summary. It may not cover all possible information. If you have questions about this medicine, talk to your doctor, pharmacist, or health care provider.  2020 Elsevier/Gold Standard (2018-12-25 19:46:27) Ubrogepant tablets What is this medicine? UBROGEPANT (ue BROE je pant) is used to treat migraine headaches with or without aura. An aura is a strange feeling or visual disturbance that warns you of an attack. It is not used to prevent migraines. This medicine may be used for other purposes; ask your health care provider or pharmacist if you have questions. COMMON BRAND NAME(S): Roselyn Meier What should I tell my health care provider before I take this medicine? They need to know if you have any of these conditions:  kidney disease  liver disease  an unusual or allergic reaction to ubrogepant, other medicines, foods, dyes, or preservatives  pregnant or trying to get pregnant  breast-feeding How should I use this medicine? Take this medicine by mouth with a glass of water. Follow the directions on the prescription label. You can take it with or without food. If it upsets your stomach, take it with  food. Take your medicine at regular intervals. Do not take it more often than directed. Do not stop taking except on your doctor's advice. Talk to your pediatrician about the use of this medicine in children. Special care may be needed. Overdosage: If you think you have taken too much of this medicine contact a poison control center or emergency room at once. NOTE: This medicine is only for you. Do not share this medicine with others. What if I miss a dose? This does not apply. This medicine is not for regular use. What may interact with this medicine? Do not take this medicine with any of the following medicines:  ceritinib  certain antibiotics like chloramphenicol, clarithromycin, telithromycin  certain antivirals for HIV like atazanavir, cobicistat, darunavir, delavirdine, fosamprenavir,  indinavir, ritonavir  certain medicines for fungal infections like itraconazole, ketoconazole, posaconazole, voriconazole  conivaptan  grapefruit  idelalisib  mifepristone  nefazodone  ribociclib This medicine may also interact with the following medications:  carvedilol  certain medicines for seizures like phenobarbital, phenytoin  ciprofloxacin  cyclosporine  eltrombopag  fluconazole  fluvoxamine  quinidine  rifampin  St. John's wort  verapamil This list may not describe all possible interactions. Give your health care provider a list of all the medicines, herbs, non-prescription drugs, or dietary supplements you use. Also tell them if you smoke, drink alcohol, or use illegal drugs. Some items may interact with your medicine. What should I watch for while using this medicine? Visit your health care professional for regular checks on your progress. Tell your health care professional if your symptoms do not start to get better or if they get worse. Your mouth may get dry. Chewing sugarless gum or sucking hard candy and drinking plenty of water may help. Contact your health  care professional if the problem does not go away or is severe. What side effects may I notice from receiving this medicine? Side effects that you should report to your doctor or health care professional as soon as possible:  allergic reactions like skin rash, itching or hives; swelling of the face, lips, or tongue Side effects that usually do not require medical attention (report these to your doctor or health care professional if they continue or are bothersome):  drowsiness  dry mouth  nausea  tiredness This list may not describe all possible side effects. Call your doctor for medical advice about side effects. You may report side effects to FDA at 1-800-FDA-1088. Where should I keep my medicine? Keep out of the reach of children. Store at room temperature between 15 and 30 degrees C (59 and 86 degrees F). Throw away any unused medicine after the expiration date. NOTE: This sheet is a summary. It may not cover all possible information. If you have questions about this medicine, talk to your doctor, pharmacist, or health care provider.  2020 Elsevier/Gold Standard (2018-08-01 08:50:55)

## 2020-03-25 NOTE — Progress Notes (Signed)
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  I reviewed prior neurology notes, patient was at the [facility] with Dr. Sima Matas, she reported her headaches unilateral but can be either side frontal and temporal, lasting 4 to 12 hours or all day, throbbing, sharp and dull, sensitivity to light, noise and worse with movement activities.  She has a rare aura of jagged lines, triggers include allergies, alcohol, fatigue, weather, wearing, medications, hormone changes and emotional stress.  Last being seen November 2020 she was doing well, taking gabapentin 3 times daily working well, as needed, patient's headaches related to hormone fluctuations, hysterectomy in November 2018, she continues to take oral contraceptives to help with hormone levels, rarely takes Seroquel for relief during severe headaches, she also has had success taking Aleve and Robaxin together, she has not needed Maxalt recently, doing well with clonazepam, doing excellent with exercise.  Patient is here and she endorses over a year of severe headaches, over 15 migraine days a month, unilateral, throbbing, pulsating, nausea, photophobia, phonophobia, no medication overuse, no aura, they can last 24 to 72 hours.She uses seroquel very sparingly because it makes her sad the next day. She takes phenergan every once in a while. She didn;t like nurtec it made her feel weired, she is on very high dose burth control but she is having a hard time coming off of it. She gets little headaches twice a week like today with the weather maybe 2 decent headaches/migraines in a month, alleve and caffeine and water, being tired and exercising too much can trigger.  We had a very long discussion about options, the new  technologies, the G pants and CGRP's, combinations of medications, Botox for migraines, oral medications, lifestyle factors, menstrual migraines and hormone fluctuations affecting migraines.  Spent an extended amount of time with patient discussing with her.  Reviewed notes, labs and imaging from outside physicians, which showed: List of medications patient has tried   ANALGESICS: Excedrin and Tylenol/Acetaminophen.  Anti-Migraine: Axert, Frova/Frovatriptan, Maxalt, Treximet and Zomig tablets/Zolmitriptan. Heart/BP: Lopressor/Toprol/Metoprolol and Tenormin/Atenolol.  Decongestant/Antihistamine: Flonase.  Anti-Nauseant: Phenergan/Promethazine.  NSAID's: Celebrex and Mobic/Meloxicam.  Muscle Relaxants: Flexeril/Cyclobenzaprine.  Anti-Convulsants: Topamax/Topiramate, Gabapentin  Steriods: Prednisone.  Sleeping Pills/Tranquilizers: Ambien/Cr/Zolipem, Lunesta/Eszopiclone, Seroquel/Quetiapine and Sonata/Zaleplon.  Anti-Depressants: Elavil/Amitriptylline and Sinequan/Doxepin.  Hormonal: Alesse and Yasmine.  Other medications/therapies: Trigger Point injections   Maxalt, gabapentin, nurtec  Review of Systems: Patient complains of symptoms per HPI as well as the following symptoms: Migraines. Pertinent negatives and positives per HPI. All others negative.   Social History   Socioeconomic History  . Marital status: Married    Spouse name: Not on file  . Number of children: 1  . Years of education: Not on file  . Highest education level: Professional school degree (e.g., MD, DDS, DVM, JD)  Occupational History  . Not on file  Tobacco Use  . Smoking status: Never Smoker  . Smokeless tobacco: Never Used  Vaping Use  . Vaping Use: Never used  Substance and Sexual Activity  . Alcohol use: Yes    Comment: 2-3 times per week  . Drug use: No  . Sexual activity: Yes    Birth control/protection: Pill  Other Topics Concern  . Not on file  Social History Narrative   Lives with  husband, son    Caffeine-1 coffee daily   Social Determinants of Health   Financial Resource Strain:   . Difficulty of Paying Living Expenses: Not on file  Food Insecurity:   . Worried About Charity fundraiser in the Last Year: Not on file  . Ran Out of Food in the Last Year: Not on file  Transportation Needs:   . Lack of Transportation (Medical): Not on file  . Lack of Transportation (Non-Medical): Not on file  Physical Activity:   . Days of Exercise per Week: Not on file  . Minutes of Exercise per Session: Not on file  Stress:   . Feeling of Stress : Not on file  Social Connections:   . Frequency of Communication with Friends and Family: Not on file  . Frequency of Social Gatherings with Friends and Family: Not on file  . Attends Religious Services: Not on file  . Active Member of Clubs or Organizations: Not on file  . Attends Archivist Meetings: Not on file  . Marital Status: Not on file  Intimate Partner Violence:   . Fear of Current or Ex-Partner: Not on file  . Emotionally Abused: Not on file  . Physically Abused: Not on file  . Sexually Abused: Not on file    Family History  Problem Relation Age of Onset  . Breast cancer Paternal Aunt   . Lupus Mother   . Kidney failure Mother   . Hypertension Mother   . Heart failure Mother   . Alcoholism Mother   . Alcoholism Father     Past Medical History:  Diagnosis Date  . Allergic rhinitis   . Anxiety   . Asthma    childhood  . Gallstones   . GERD (gastroesophageal reflux disease)   . Insomnia   . MENST MIGRAINE W/INTRACT W/O STATUS MIGRAINOSUS 07/16/2007   Qualifier: Diagnosis of  By: Council Mechanic MD, Hilaria Ota   . SLE (systemic lupus erythematosus) ([place])    no medication at this time    Patient Active Problem List   Diagnosis Date Noted  . Chronic migraine without aura without status migrainosus, not intractable 03/29/2020  . Fibroids 04/04/2017  . Rotator cuff syndrome of right shoulder 01/21/2017  . Pain in  both knees 03/09/2016  . Post-operative state 11/24/2013  . Gallstones 10/22/2013  . Cholelithiasis 10/10/2013  . ABNORMALITY OF GAIT 01/27/2010  . KNEE PAIN, BILATERAL 01/25/2010  . CALF PAIN, LEFT 07/06/2009  . MUSCLE STRAIN, RIGHT CALF 06/29/2009  . DIARRHEA 07/22/2007  . ABDOMINAL PAIN 07/22/2007  . MENST MIGRAINE W/INTRACT W/O STATUS MIGRAINOSUS 07/16/2007  . RUQ abdominal pain 10/29/2006    Past Surgical History:  Procedure Laterality Date  . ABDOMINAL HYSTERECTOMY N/A 04/04/2017   Procedure: HYSTERECTOMY ABDOMINAL;  Surgeon: Dian Queen, MD;  Location: [facility];  Service: Gynecology;  Laterality: N/A;  . BILATERAL SALPINGECTOMY Bilateral 04/04/2017   Procedure: BILATERAL SALPINGECTOMY;  Surgeon: Dian Queen, MD;  Location: [facility];  Service: Gynecology;  Laterality: Bilateral;  . BREAST SURGERY  2010   reduction-bilat  . CHOLECYSTECTOMY N/A 11/12/2013   Procedure: LAPAROSCOPIC CHOLECYSTECTOMY;  Surgeon: Joyice Faster. Cornett, MD;  Location: [facility];  Service: General;  Laterality: N/A;  . LAPAROSCOPIC CHOLECYSTECTOMY  10/2013   ([place])  . TONSILLECTOMY    . URETHRAL DILATION      Current Outpatient Medications  Medication Sig Dispense Refill  . buPROPion (WELLBUTRIN XL) 150 MG 24 hr tablet Take 150 mg by mouth daily.    Marland Kitchen  cetirizine (ZYRTEC) 10 MG tablet Take 10 mg by mouth daily.    . clonazePAM (KLONOPIN) 0.5 MG tablet Take 0.5 mg by mouth at bedtime    . gabapentin (NEURONTIN) 600 MG tablet Take 1 tablet (600 mg total) by mouth 3 (three) times daily. (Patient taking differently: Take 300 mg by mouth See admin instructions. Take 300 mg by mouth at noon and take 300 mg by mouth at bedtime) 270 tablet 3  . MAGNESIUM PO Take 500 mg by mouth at bedtime.    . montelukast (SINGULAIR) 10 MG tablet Take 10 mg by mouth at bedtime.    . rizatriptan (MAXALT) 10 MG tablet Take 10 mg by mouth as needed for migraine.     Marland Kitchen UNABLE TO FIND Med Name: Midge Aver, birth  control    . Fremanezumab-vfrm (AJOVY) 225 MG/1.5ML SOAJ Inject 225 mg into the skin every 30 (thirty) days. 1.5 mL 0  . Lasmiditan Succinate (REYVOW) 100 MG TABS Take 100 mg by mouth at bedtime as needed. 2 tablet 0  . Ubrogepant (UBRELVY) 100 MG TABS Take 100 mg by mouth every 2 (two) hours as needed. Maximum 200mg  a day. 10 tablet 0   No current facility-administered medications for this visit.    Allergies as of 03/25/2020 - Review Complete 03/25/2020  Allergen Reaction Noted  . Iodine Nausea And Vomiting 07/06/2009  . Penicillins Rash 07/06/2009    Vitals: BP 114/73   Pulse 92   Ht 5' 3.5" (1.613 m)   Wt 144 lb (65.3 kg)   BMI 25.11 kg/m  Last Weight:  Wt Readings from Last 1 Encounters:  03/25/20 144 lb (65.3 kg)   Last Height:   Ht Readings from Last 1 Encounters:  03/25/20 5' 3.5" (1.613 m)     Physical exam: Exam: Gen: NAD, conversant, well nourised, well groomed                     CV: RRR, no MRG. No Carotid Bruits. No peripheral edema, warm, nontender Eyes: Conjunctivae clear without exudates or hemorrhage  Neuro: Detailed Neurologic Exam  Speech:    Speech is normal; fluent and spontaneous with normal comprehension.  Cognition:    The patient is oriented to person, place, and time;     recent and remote memory intact;     language fluent;     normal attention, concentration,     fund of knowledge Cranial Nerves:    The pupils are equal, round, and reactive to light. The fundi are normal and spontaneous venous pulsations are present. Visual fields are full to finger confrontation. Extraocular movements are intact. Trigeminal sensation is intact and the muscles of mastication are normal. The face is symmetric. The palate elevates in the midline. Hearing intact. Voice is normal. Shoulder shrug is normal. The tongue has normal motion without fasciculations.   Coordination:    Normal finger to nose and heel to shin. Normal rapid alternating movements.    Gait:    Heel-toe and tandem gait are normal.   Motor Observation:    No asymmetry, no atrophy, and no involuntary movements noted. Tone:    Normal muscle tone.    Posture:    Posture is normal. normal erect    Strength:    Strength is V/V in the upper and lower limbs.      Sensation: intact to LT     Reflex Exam:  DTR's:    Deep tendon reflexes in the upper and lower extremities  are normal bilaterally.   Toes:    The toes are downgoing bilaterally.   Clonus:    Clonus is absent.    Assessment/Plan: This is a patient with chronic migraines.We had a very long discussion about options, the new technologies, the Gpants and CGRP's, combinations of medications, Botox for migraines,other oral medications, lifestyle factors, menstrual migraines and hormone fluctuations affecting migraines.  Spent an extended amount of time with patient discussing with her.  Goal is to get patient off her hormone therapy due to safety and side effects; it gives her insomnia, anxiety, also with her remote history of aura as it is a stroke risk factor.  However when she is tried to come off for hormone replacement she has had severe migraines.  At this time we will start Ajovy and Botox.  Also gave her samples of Ubrelvy and Nurtec to try acutely but discussed that both of these medications can be used preventatively; Nurtec can be used preventatively every other day and Roselyn Meier has a new daily preventative called Qulipta.  I also gave her samples of lasmiditan hand.   No orders of the defined types were placed in this encounter.  Meds ordered this encounter  Medications  . Fremanezumab-vfrm (AJOVY) 225 MG/1.5ML SOAJ    Sig: Inject 225 mg into the skin every 30 (thirty) days.    Dispense:  1.5 mL    Refill:  0    Patient has copay card; she can have medication regardless of insurance approval or copay amount.  Marland Kitchen Ubrogepant (UBRELVY) 100 MG TABS    Sig: Take 100 mg by mouth every 2 (two) hours as  needed. Maximum 200mg  a day.    Dispense:  10 tablet    Refill:  0  . Lasmiditan Succinate (REYVOW) 100 MG TABS    Sig: Take 100 mg by mouth at bedtime as needed.    Dispense:  2 tablet    Refill:  0    Cc: Ria Bush, MD,  Ria Bush, MD  Sarina Ill, MD  [facility] [address] [place], [place] [address]  Phone [phone] Fax [phone]  I spent over 60 minutes of face-to-face and non-face-to-face time with patient on the  1. Chronic migraine without aura without status migrainosus, not intractable    diagnosis.  This included previsit chart review, lab review, study review, order entry, electronic health record documentation, patient education on the different diagnostic and therapeutic options, counseling and coordination of care, risks and benefits of management, compliance, or risk factor reduction

## 2020-03-29 ENCOUNTER — Encounter: Payer: Self-pay | Admitting: Neurology

## 2020-03-29 DIAGNOSIS — G43709 Chronic migraine without aura, not intractable, without status migrainosus: Secondary | ICD-10-CM | POA: Insufficient documentation

## 2020-03-30 ENCOUNTER — Ambulatory Visit (INDEPENDENT_AMBULATORY_CARE_PROVIDER_SITE_OTHER): Payer: Self-pay | Admitting: Neurology

## 2020-03-30 DIAGNOSIS — G43709 Chronic migraine without aura, not intractable, without status migrainosus: Secondary | ICD-10-CM

## 2020-03-30 NOTE — Progress Notes (Signed)
Botox consent signed by patient  Botox- 200 units x 1 vial Lot: [id number] Expiration: 07/2022 NDC: 5198-2429-98  Bacteriostatic 0.9% Sodium Chloride- 34mL total Lot: [id number] Expiration: 06/29/2021 NDC: 7227-7375-05  Dx: J07.125 sample

## 2020-03-30 NOTE — Progress Notes (Signed)
03/30/2020: This is our first botox. Using samples. NO CHARGE  Consent Form Botulism Toxin Injection For Chronic Migraine    Reviewed orally with patient, additionally signature is on file:  Botulism toxin has been approved by the Federal drug administration for treatment of chronic migraine. Botulism toxin does not cure chronic migraine and it may not be effective in some patients.  The administration of botulism toxin is accomplished by injecting a small amount of toxin into the muscles of the neck and head. Dosage must be titrated for each individual. Any benefits resulting from botulism toxin tend to wear off after 3 months with a repeat injection required if benefit is to be maintained. Injections are usually done every 3-4 months with maximum effect peak achieved by about 2 or 3 weeks. Botulism toxin is expensive and you should be sure of what costs you will incur resulting from the injection.  The side effects of botulism toxin use for chronic migraine may include:   -Transient, and usually mild, facial weakness with facial injections  -Transient, and usually mild, head or neck weakness with head/neck injections  -Reduction or loss of forehead facial animation due to forehead muscle weakness  -Eyelid drooping  -Dry eye  -Pain at the site of injection or bruising at the site of injection  -Double vision  -Potential unknown long term risks  Contraindications: You should not have Botox if you are pregnant, nursing, allergic to albumin, have an infection, skin condition, or muscle weakness at the site of the injection, or have myasthenia gravis, Lambert-Eaton syndrome, or ALS.  It is also possible that as with any injection, there may be an allergic reaction or no effect from the medication. Reduced effectiveness after repeated injections is sometimes seen and rarely infection at the injection site may occur. All care will be taken to prevent these side effects. If therapy is given over a  long time, atrophy and wasting in the muscle injected may occur. Occasionally the patient's become refractory to treatment because they develop antibodies to the toxin. In this event, therapy needs to be modified.  I have read the above information and consent to the administration of botulism toxin.    BOTOX PROCEDURE NOTE FOR MIGRAINE HEADACHE    Contraindications and precautions discussed with patient(above). Aseptic procedure was observed and patient tolerated procedure. Procedure performed by Dr. Georgia Dom  The condition has existed for more than 6 months, and pt does not have a diagnosis of ALS, Myasthenia Gravis or Lambert-Eaton Syndrome.  Risks and benefits of injections discussed and pt agrees to proceed with the procedure.  Written consent obtained  These injections are medically necessary. Pt  receives good benefits from these injections. These injections do not cause sedations or hallucinations which the oral therapies may cause.  Description of procedure:  The patient was placed in a sitting position. The standard protocol was used for Botox as follows, with 5 units of Botox injected at each site:   -Procerus muscle, midline injection  -Corrugator muscle, bilateral injection  -Frontalis muscle, bilateral injection, with 2 sites each side, medial injection was performed in the upper one third of the frontalis muscle, in the region vertical from the medial inferior edge of the superior orbital rim. The lateral injection was again in the upper one third of the forehead vertically above the lateral limbus of the cornea, 1.5 cm lateral to the medial injection site.  -Temporalis muscle injection, 4 sites, bilaterally. The first injection was 3 cm above the tragus  of the ear, second injection site was 1.5 cm to 3 cm up from the first injection site in line with the tragus of the ear. The third injection site was 1.5-3 cm forward between the first 2 injection sites. The fourth  injection site was 1.5 cm posterior to the second injection site.   -Occipitalis muscle injection, 3 sites, bilaterally. The first injection was done one half way between the occipital protuberance and the tip of the mastoid process behind the ear. The second injection site was done lateral and superior to the first, 1 fingerbreadth from the first injection. The third injection site was 1 fingerbreadth superiorly and medially from the first injection site.  -Cervical paraspinal muscle injection, 2 sites, bilateral knee first injection site was 1 cm from the midline of the cervical spine, 3 cm inferior to the lower border of the occipital protuberance. The second injection site was 1.5 cm superiorly and laterally to the first injection site.  -Trapezius muscle injection was performed at 3 sites, bilaterally. The first injection site was in the upper trapezius muscle halfway between the inflection point of the neck, and the acromion. The second injection site was one half way between the acromion and the first injection site. The third injection was done between the first injection site and the inflection point of the neck.   Will return for repeat injection in 3 months.   200 units of Botox was used, any Botox not injected was wasted. The patient tolerated the procedure well, there were no complications of the above procedure.

## 2020-04-08 DIAGNOSIS — Z1231 Encounter for screening mammogram for malignant neoplasm of breast: Secondary | ICD-10-CM | POA: Diagnosis not present

## 2020-05-05 ENCOUNTER — Other Ambulatory Visit: Payer: Self-pay | Admitting: Neurology

## 2020-05-05 MED ORDER — CLONAZEPAM 0.5 MG PO TABS
ORAL_TABLET | ORAL | 4 refills | Status: DC
Start: 2020-05-05 — End: 2020-05-05

## 2020-05-05 MED FILL — clonazePAM 0.5 MG TABS: 0.5 | 30 days supply | Qty: 30 | Fill #0

## 2020-05-06 ENCOUNTER — Other Ambulatory Visit: Payer: Self-pay | Admitting: Neurology

## 2020-05-06 DIAGNOSIS — F5101 Primary insomnia: Secondary | ICD-10-CM

## 2020-05-12 MED FILL — ESTROGEL 0.06% GEL: 0.75 MG/1.2 | 30 days supply | Qty: 50 | Fill #1

## 2020-05-31 ENCOUNTER — Other Ambulatory Visit (HOSPITAL_COMMUNITY): Payer: Self-pay | Admitting: Radiology

## 2020-05-31 MED FILL — MONTELUKAST SOD 10 MG TAB: 10 | 90 days supply | Qty: 90 | Fill #0

## 2020-06-04 MED FILL — buPROPion HCL ER (XL) 150 M: 150 | 90 days supply | Qty: 90 | Fill #1

## 2020-06-08 ENCOUNTER — Other Ambulatory Visit: Payer: Self-pay

## 2020-06-08 ENCOUNTER — Ambulatory Visit: Payer: 59 | Admitting: Neurology

## 2020-06-08 DIAGNOSIS — G43709 Chronic migraine without aura, not intractable, without status migrainosus: Secondary | ICD-10-CM

## 2020-06-08 MED ORDER — DICLOFENAC POTASSIUM(MIGRAINE) 50 MG PO PACK: 50.0000 mg | PACK | Freq: Three times a day (TID) | ORAL | 0 refills | Status: DC | PRN

## 2020-06-08 MED ORDER — BELSOMRA 20 MG PO TABS: 20.0000 mg | ORAL_TABLET | Freq: Every evening | ORAL | 0 refills | Status: DC | PRN

## 2020-06-08 NOTE — Progress Notes (Signed)
Botox- 200 units x 1 vial Lot: [id number] Expiration: 01/2023 NDC: 0023-3921-02  Bacteriostatic 0.9% Sodium Chloride- 4mL total Lot: [id number] Expiration: 06/29/2021 NDC: 0409-1966-02  Dx: G43.709 B/B  

## 2020-06-08 NOTE — Progress Notes (Signed)
03/30/2020: This is our first botox. Using samples. NO CHARGE  Consent Form Botulism Toxin Injection For Chronic Migraine  06/08/2020: She is doing great! Her migraine frequency is significantly reduced. +a. If she has any forehead lowering we can do her frontalis a little higher next time (she has a heavy brow). She is off of her HRT. She loves the [place] as well and the Ajovy. Gave her some belsomra to try as samples 20mg  and cambia to try.   Reviewed orally with patient, additionally signature is on file:  Botulism toxin has been approved by the Federal drug administration for treatment of chronic migraine. Botulism toxin does not cure chronic migraine and it may not be effective in some patients.  The administration of botulism toxin is accomplished by injecting a small amount of toxin into the muscles of the neck and head. Dosage must be titrated for each individual. Any benefits resulting from botulism toxin tend to wear off after 3 months with a repeat injection required if benefit is to be maintained. Injections are usually done every 3-4 months with maximum effect peak achieved by about 2 or 3 weeks. Botulism toxin is expensive and you should be sure of what costs you will incur resulting from the injection.  The side effects of botulism toxin use for chronic migraine may include:   -Transient, and usually mild, facial weakness with facial injections  -Transient, and usually mild, head or neck weakness with head/neck injections  -Reduction or loss of forehead facial animation due to forehead muscle weakness  -Eyelid drooping  -Dry eye  -Pain at the site of injection or bruising at the site of injection  -Double vision  -Potential unknown long term risks  Contraindications: You should not have Botox if you are pregnant, nursing, allergic to albumin, have an infection, skin condition, or muscle weakness at the site of the injection, or have myasthenia gravis, Lambert-Eaton syndrome, or  ALS.  It is also possible that as with any injection, there may be an allergic reaction or no effect from the medication. Reduced effectiveness after repeated injections is sometimes seen and rarely infection at the injection site may occur. All care will be taken to prevent these side effects. If therapy is given over a long time, atrophy and wasting in the muscle injected may occur. Occasionally the patient's become refractory to treatment because they develop antibodies to the toxin. In this event, therapy needs to be modified.  I have read the above information and consent to the administration of botulism toxin.    BOTOX PROCEDURE NOTE FOR MIGRAINE HEADACHE    Contraindications and precautions discussed with patient(above). Aseptic procedure was observed and patient tolerated procedure. Procedure performed by Dr. Georgia Dom  The condition has existed for more than 6 months, and pt does not have a diagnosis of ALS, Myasthenia Gravis or Lambert-Eaton Syndrome.  Risks and benefits of injections discussed and pt agrees to proceed with the procedure.  Written consent obtained  These injections are medically necessary. Pt  receives good benefits from these injections. These injections do not cause sedations or hallucinations which the oral therapies may cause.  Description of procedure:  The patient was placed in a sitting position. The standard protocol was used for Botox as follows, with 5 units of Botox injected at each site:   -Procerus muscle, midline injection  -Corrugator muscle, bilateral injection  -Frontalis muscle, bilateral injection, with 2 sites each side, medial injection was performed in the upper one third of the  frontalis muscle, in the region vertical from the medial inferior edge of the superior orbital rim. The lateral injection was again in the upper one third of the forehead vertically above the lateral limbus of the cornea, 1.5 cm lateral to the medial injection  site.  -Temporalis muscle injection, 4 sites, bilaterally. The first injection was 3 cm above the tragus of the ear, second injection site was 1.5 cm to 3 cm up from the first injection site in line with the tragus of the ear. The third injection site was 1.5-3 cm forward between the first 2 injection sites. The fourth injection site was 1.5 cm posterior to the second injection site.   -Occipitalis muscle injection, 3 sites, bilaterally. The first injection was done one half way between the occipital protuberance and the tip of the mastoid process behind the ear. The second injection site was done lateral and superior to the first, 1 fingerbreadth from the first injection. The third injection site was 1 fingerbreadth superiorly and medially from the first injection site.  -Cervical paraspinal muscle injection, 2 sites, bilateral knee first injection site was 1 cm from the midline of the cervical spine, 3 cm inferior to the lower border of the occipital protuberance. The second injection site was 1.5 cm superiorly and laterally to the first injection site.  -Trapezius muscle injection was performed at 3 sites, bilaterally. The first injection site was in the upper trapezius muscle halfway between the inflection point of the neck, and the acromion. The second injection site was one half way between the acromion and the first injection site. The third injection was done between the first injection site and the inflection point of the neck.   Will return for repeat injection in 3 months.   200 units of Botox was used, any Botox not injected was wasted. The patient tolerated the procedure well, there were no complications of the above procedure.

## 2020-06-25 MED FILL — clonazePAM 0.5 MG TABS: 0.5 | 30 days supply | Qty: 30 | Fill #1

## 2020-06-28 MED FILL — ESTROGEL 0.06% GEL: 0.75 MG/1.2 | 30 days supply | Qty: 50 | Fill #2

## 2020-06-30 ENCOUNTER — Other Ambulatory Visit: Payer: Self-pay | Admitting: Neurology

## 2020-06-30 DIAGNOSIS — G43711 Chronic migraine without aura, intractable, with status migrainosus: Secondary | ICD-10-CM

## 2020-06-30 MED ORDER — AJOVY 225 MG/1.5ML ~~LOC~~ SOAJ: 225.0000 mg | SUBCUTANEOUS | 11 refills | Status: DC

## 2020-06-30 MED FILL — AJOVY 225 MG/1.5ML SOAJ: 225 | 30 days supply | Qty: 2 | Fill #0

## 2020-07-08 ENCOUNTER — Other Ambulatory Visit: Payer: Self-pay | Admitting: Neurology

## 2020-07-08 DIAGNOSIS — G43711 Chronic migraine without aura, intractable, with status migrainosus: Secondary | ICD-10-CM

## 2020-07-08 MED ORDER — UBRELVY 100 MG PO TABS
100.0000 mg | ORAL_TABLET | ORAL | 11 refills | Status: DC | PRN
Start: 2020-07-08 — End: 2020-07-08

## 2020-07-08 MED ORDER — GABAPENTIN 600 MG PO TABS
600.0000 mg | ORAL_TABLET | Freq: Three times a day (TID) | ORAL | 3 refills | Status: DC
Start: 2020-07-08 — End: 2020-07-08

## 2020-07-08 MED FILL — GABAPENTIN 600 MG TABLET: 600 | 90 days supply | Qty: 270 | Fill #0

## 2020-07-15 ENCOUNTER — Telehealth: Payer: Self-pay | Admitting: *Deleted

## 2020-07-15 NOTE — Telephone Encounter (Signed)
Completed Ubrelvy PA on CMM. Key: [id number]. Approved immediately. The request has been approved. The authorization is effective for a maximum of 6 fills from 07/15/2020 to 01/11/2021, as long as the member is enrolled in their current health plan. This has been approved for a quantity limit of 16.0 with a day supply limit of 30.0. A written notification letter will follow with additional details.

## 2020-07-22 MED FILL — UBRELVY 100 MG TABS: 100 | 30 days supply | Qty: 16 | Fill #0

## 2020-08-20 ENCOUNTER — Other Ambulatory Visit (HOSPITAL_BASED_OUTPATIENT_CLINIC_OR_DEPARTMENT_OTHER): Payer: Self-pay

## 2020-08-24 ENCOUNTER — Telehealth: Payer: Self-pay | Admitting: *Deleted

## 2020-08-24 NOTE — Telephone Encounter (Signed)
Completed Ajovy PA on Cover My Meds. Key: [id number]. Awaiting determination from [place].

## 2020-08-25 MED FILL — AJOVY 225 MG/1.5ML SOAJ: 225 | 30 days supply | Qty: 2 | Fill #1

## 2020-08-26 ENCOUNTER — Other Ambulatory Visit (HOSPITAL_COMMUNITY): Payer: Self-pay | Admitting: Radiology

## 2020-08-26 MED FILL — MONTELUKAST SOD 10 MG TAB: 10 | 90 days supply | Qty: 90 | Fill #0

## 2020-08-26 NOTE — Telephone Encounter (Signed)
Received a denial letter from [place]. Ajovy denied due to requirement of trying and failing Aimovig and Emgality first. Madison Nguyen how much longer pt can use copay card but she can continue to use it. If appeal desired, fax within 180 days to [phone]. PA [phone].

## 2020-08-31 ENCOUNTER — Other Ambulatory Visit (HOSPITAL_COMMUNITY): Payer: Self-pay

## 2020-08-31 MED FILL — Bupropion HCl Tab ER 24HR 150 MG: ORAL | 90 days supply | Qty: 90 | Fill #0 | Status: AC

## 2020-09-06 ENCOUNTER — Other Ambulatory Visit (HOSPITAL_COMMUNITY): Payer: Self-pay

## 2020-09-20 ENCOUNTER — Other Ambulatory Visit (HOSPITAL_COMMUNITY): Payer: Self-pay

## 2020-09-20 MED FILL — Ubrogepant Tab 100 MG: ORAL | 30 days supply | Qty: 16 | Fill #0 | Status: AC

## 2020-09-21 ENCOUNTER — Other Ambulatory Visit (HOSPITAL_COMMUNITY): Payer: Self-pay

## 2020-09-21 MED ORDER — ESTRADIOL 0.05 MG/24HR TD PTTW
MEDICATED_PATCH | TRANSDERMAL | 11 refills | Status: DC
  Filled 2020-09-21: qty 8, 28d supply, fill #0
  Filled 2020-10-16: qty 8, 28d supply, fill #1
  Filled 2020-11-15: qty 8, 28d supply, fill #2
  Filled 2020-12-13: qty 8, 28d supply, fill #3
  Filled 2021-01-13: qty 8, 28d supply, fill #4
  Filled 2021-02-13: qty 8, 28d supply, fill #5
  Filled 2021-03-13: qty 8, 28d supply, fill #6

## 2020-09-22 ENCOUNTER — Other Ambulatory Visit (HOSPITAL_COMMUNITY): Payer: Self-pay

## 2020-09-22 MED FILL — Fremanezumab-vfrm Subcutaneous Soln Auto-inj 225 MG/1.5ML: SUBCUTANEOUS | 90 days supply | Qty: 4.5 | Fill #0 | Status: CN

## 2020-09-24 ENCOUNTER — Other Ambulatory Visit (HOSPITAL_COMMUNITY): Payer: Self-pay

## 2020-09-24 MED FILL — Fremanezumab-vfrm Subcutaneous Soln Auto-inj 225 MG/1.5ML: SUBCUTANEOUS | 30 days supply | Qty: 1.5 | Fill #0 | Status: CN

## 2020-09-26 ENCOUNTER — Other Ambulatory Visit: Payer: Self-pay | Admitting: Neurology

## 2020-09-26 MED ORDER — EMGALITY 120 MG/ML ~~LOC~~ SOAJ
120.0000 mg | SUBCUTANEOUS | 11 refills | Status: DC
  Filled 2020-09-26 – 2020-09-27 (×2): qty 1, 30d supply, fill #0
  Filled 2020-10-26: qty 1, 30d supply, fill #1
  Filled 2020-11-24: qty 1, 30d supply, fill #2
  Filled 2020-12-20: qty 1, 30d supply, fill #3
  Filled 2021-01-20: qty 1, 30d supply, fill #4
  Filled 2021-02-18: qty 1, 30d supply, fill #5
  Filled 2021-03-20: qty 1, 30d supply, fill #6
  Filled 2021-04-20: qty 1, 30d supply, fill #7
  Filled 2021-05-20: qty 1, 30d supply, fill #8

## 2020-09-27 ENCOUNTER — Other Ambulatory Visit (HOSPITAL_BASED_OUTPATIENT_CLINIC_OR_DEPARTMENT_OTHER): Payer: Self-pay

## 2020-09-27 ENCOUNTER — Telehealth: Payer: Self-pay | Admitting: Neurology

## 2020-09-27 ENCOUNTER — Other Ambulatory Visit (HOSPITAL_COMMUNITY): Payer: Self-pay

## 2020-09-27 ENCOUNTER — Other Ambulatory Visit: Payer: Self-pay

## 2020-09-27 NOTE — Telephone Encounter (Signed)
PA completed through CMM/medimpact. KEYRicardo Nguyen May take up to 48 hrs to hear a response

## 2020-09-27 NOTE — Telephone Encounter (Signed)
PA APPROVED The authorization is effective for a maximum of 5 fills from 10/20/2020 to 03/21/2021, as long as the member is enrolled in their current health plan.

## 2020-09-28 ENCOUNTER — Other Ambulatory Visit (HOSPITAL_COMMUNITY): Payer: Self-pay

## 2020-09-28 NOTE — Addendum Note (Signed)
Addended by: Gildardo Griffes on: 09/28/2020 08:14 AM   Modules accepted: Orders

## 2020-09-30 NOTE — Telephone Encounter (Signed)
Received approval letter via fax. Faxed to pharmacy. Received a receipt of confirmation.

## 2020-10-03 IMAGING — DX DG CHEST 2V
2 series · 2 of 2 positions shown · non-contrast
Comparison: None.

CLINICAL DATA: Fever and cough.

EXAM:
CHEST - 2 VIEW

[chest pa]
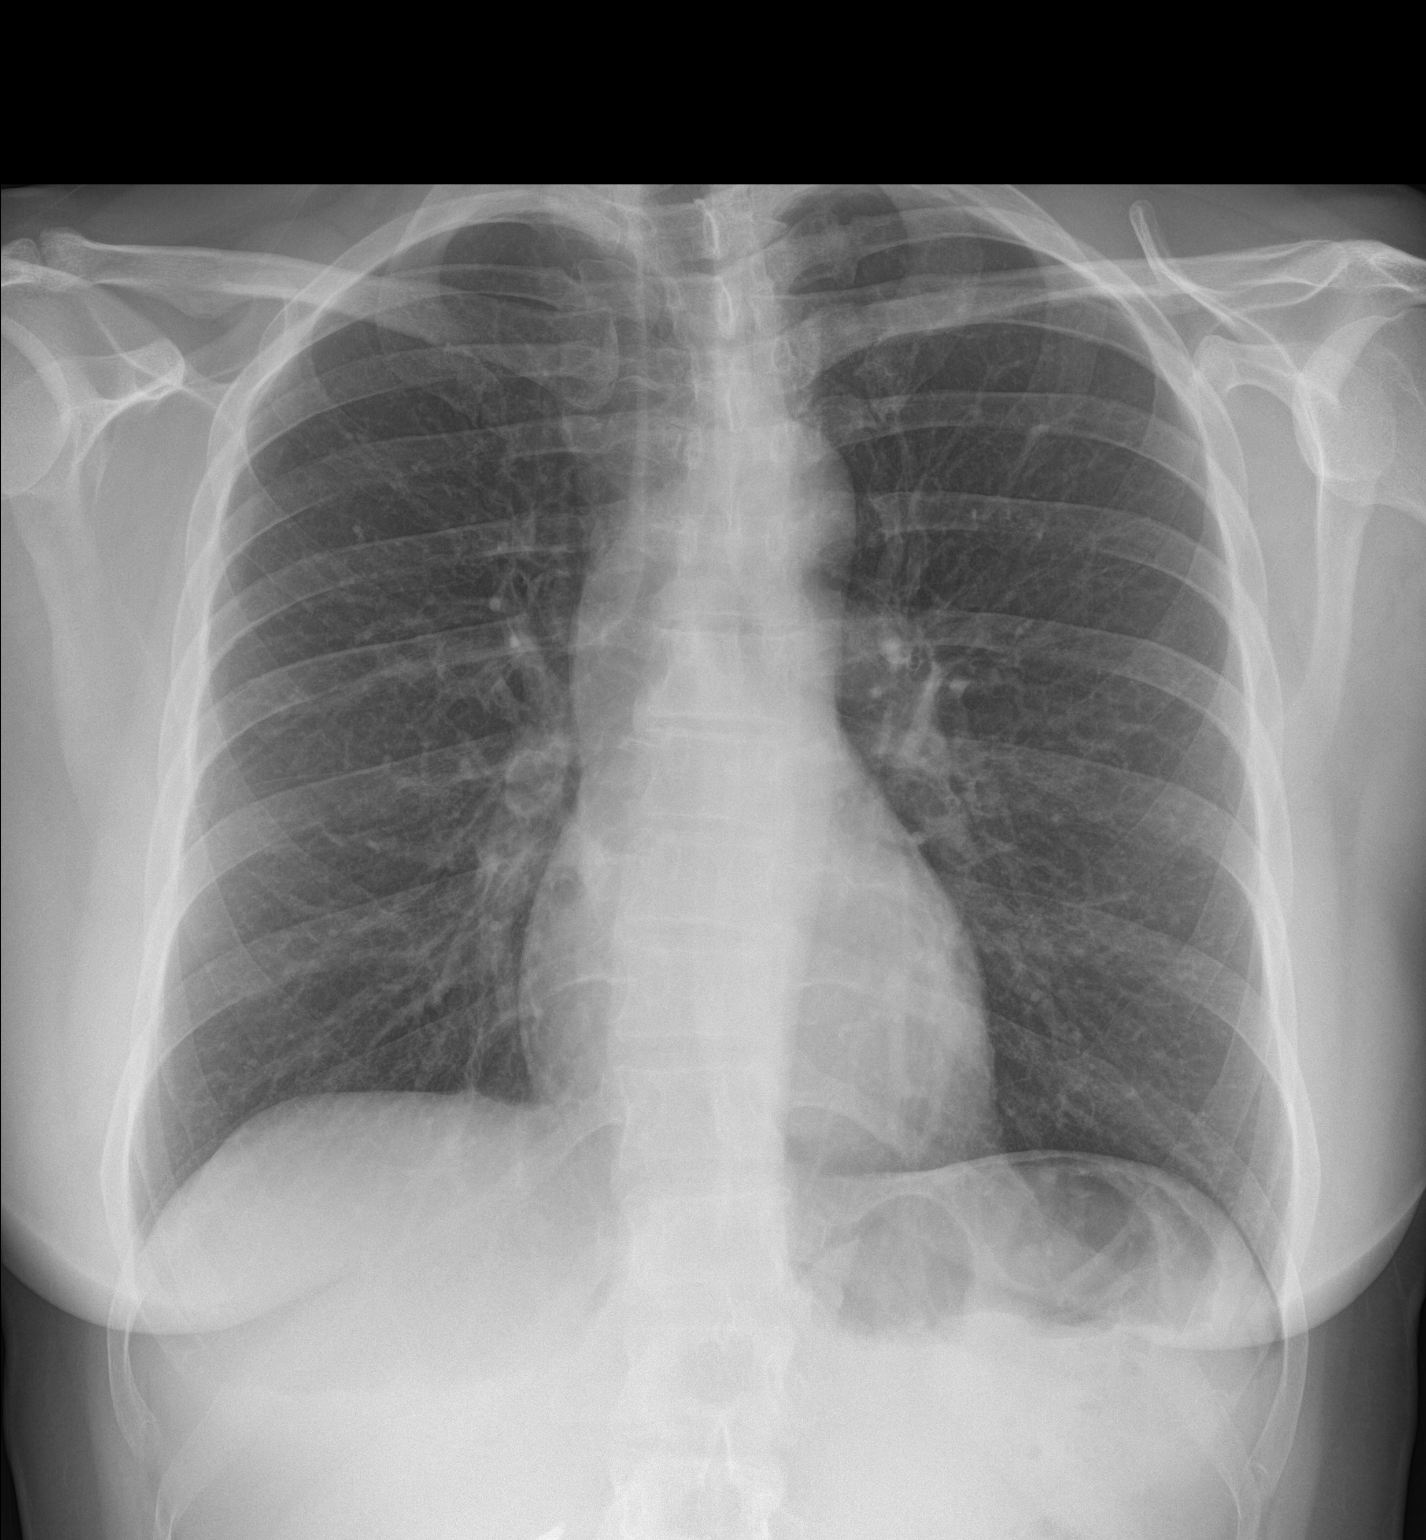

[chest lat]
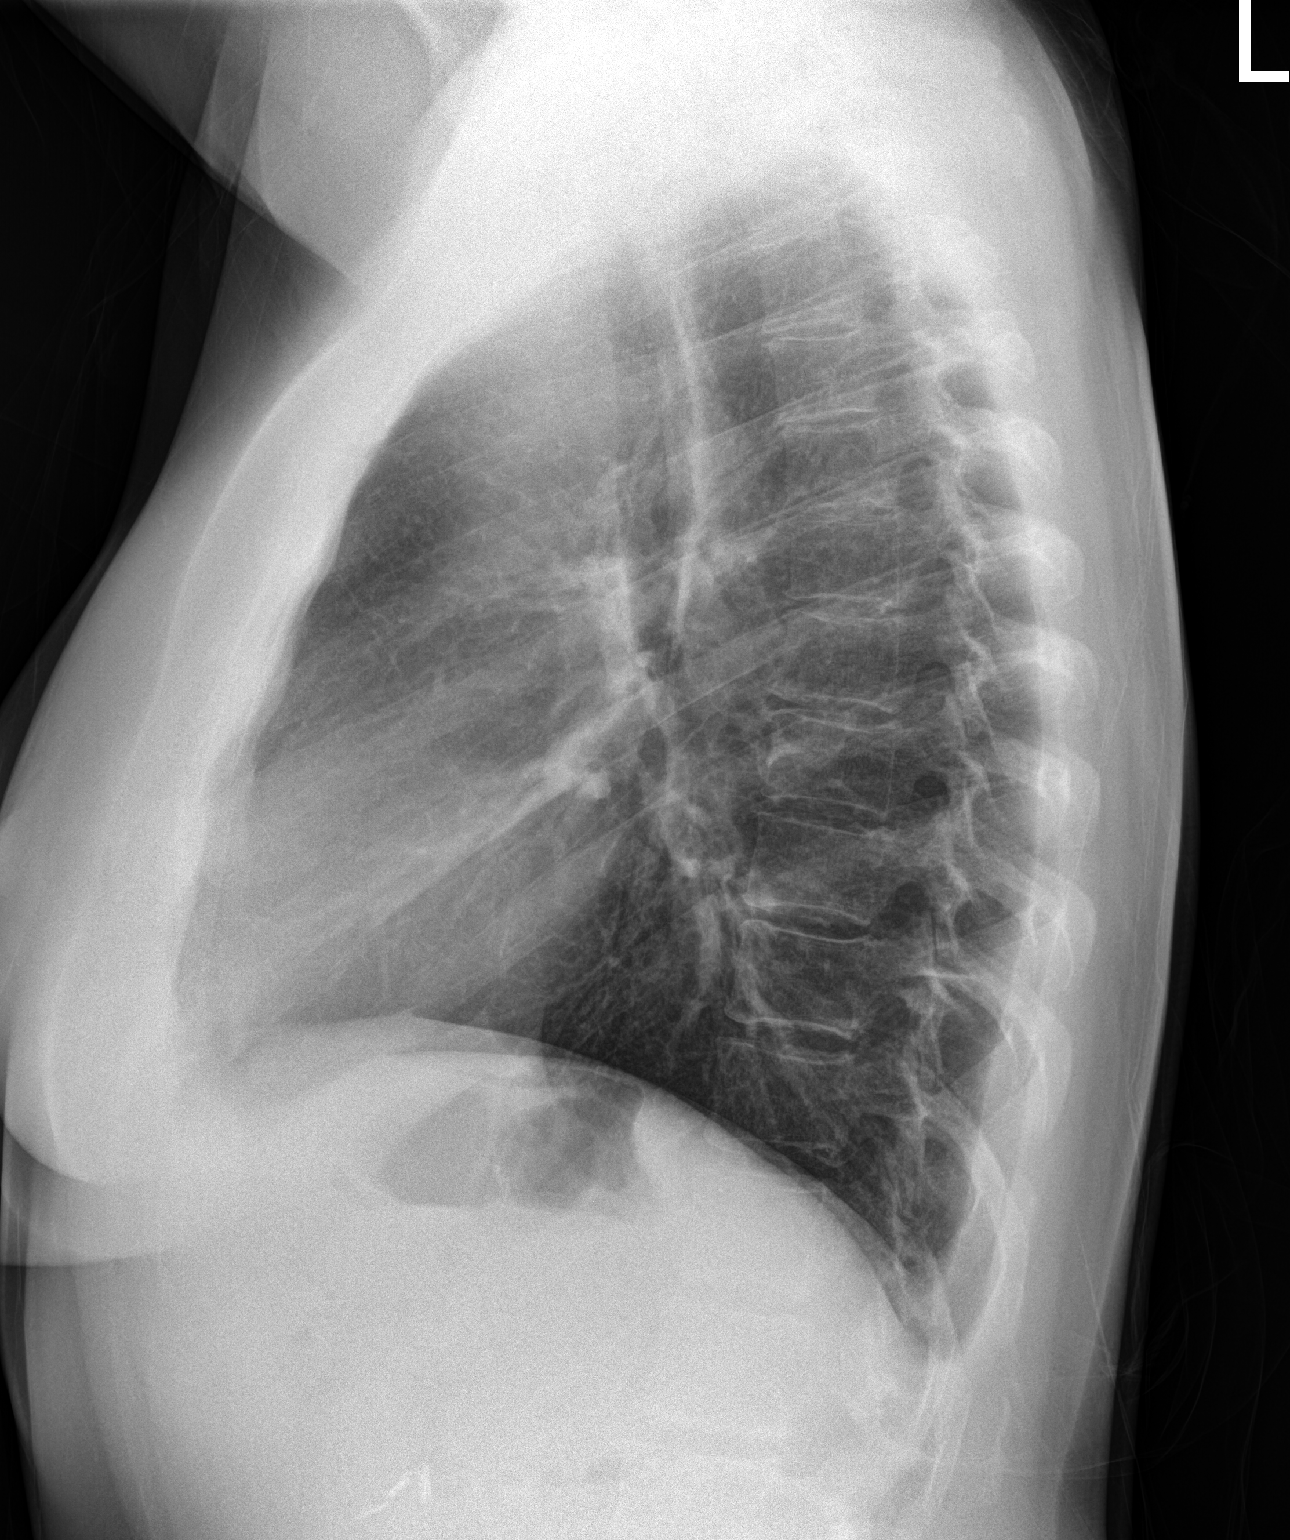

[2 of 2 positions shown; findings below may reference images not displayed]

FINDINGS: The heart size and mediastinal contours are within normal limits.
Both lungs are clear. Mild thoracic levoscoliosis noted.
IMPRESSION: No active cardiopulmonary disease.  Mild scoliosis.

## 2020-10-07 MED FILL — Progesterone Cap 100 MG: ORAL | 90 days supply | Qty: 90 | Fill #0 | Status: AC

## 2020-10-08 ENCOUNTER — Other Ambulatory Visit (HOSPITAL_COMMUNITY): Payer: Self-pay

## 2020-10-16 ENCOUNTER — Other Ambulatory Visit (HOSPITAL_COMMUNITY): Payer: Self-pay

## 2020-10-16 MED FILL — Clonazepam Tab 0.5 MG: ORAL | 30 days supply | Qty: 30 | Fill #0 | Status: AC

## 2020-10-18 ENCOUNTER — Other Ambulatory Visit (HOSPITAL_COMMUNITY): Payer: Self-pay

## 2020-10-19 ENCOUNTER — Other Ambulatory Visit (HOSPITAL_COMMUNITY): Payer: Self-pay

## 2020-10-26 ENCOUNTER — Other Ambulatory Visit (HOSPITAL_COMMUNITY): Payer: Self-pay

## 2020-11-15 ENCOUNTER — Other Ambulatory Visit (HOSPITAL_COMMUNITY): Payer: Self-pay

## 2020-11-24 ENCOUNTER — Other Ambulatory Visit (HOSPITAL_COMMUNITY): Payer: Self-pay

## 2020-11-24 MED FILL — Montelukast Sodium Tab 10 MG (Base Equiv): ORAL | 90 days supply | Qty: 90 | Fill #0 | Status: AC

## 2020-11-28 MED FILL — Bupropion HCl Tab ER 24HR 150 MG: ORAL | 90 days supply | Qty: 90 | Fill #1 | Status: AC

## 2020-11-30 ENCOUNTER — Other Ambulatory Visit (HOSPITAL_COMMUNITY): Payer: Self-pay

## 2020-12-14 ENCOUNTER — Other Ambulatory Visit (HOSPITAL_COMMUNITY): Payer: Self-pay

## 2020-12-20 ENCOUNTER — Other Ambulatory Visit (HOSPITAL_COMMUNITY): Payer: Self-pay

## 2021-01-03 ENCOUNTER — Telehealth: Payer: Self-pay

## 2021-01-03 NOTE — Telephone Encounter (Signed)
I submitted a PA request for Ubrelvy '100mg'$  on CMM, Key: [id number]. Received approval.    The authorization is effective for a maximum of 12 fill(s) from 01/03/2021 to 01/02/2022, as long as you are enrolled as a member of your current health plan. The request was approved with a quantity restriction. This has been approved for a quantity limit of 16 with a day supply limit of 30.

## 2021-01-10 ENCOUNTER — Other Ambulatory Visit (HOSPITAL_COMMUNITY): Payer: Self-pay

## 2021-01-10 MED FILL — Progesterone Cap 100 MG: ORAL | 90 days supply | Qty: 90 | Fill #1 | Status: AC

## 2021-01-13 ENCOUNTER — Other Ambulatory Visit (HOSPITAL_COMMUNITY): Payer: Self-pay

## 2021-01-14 ENCOUNTER — Other Ambulatory Visit (HOSPITAL_COMMUNITY): Payer: Self-pay

## 2021-01-20 ENCOUNTER — Other Ambulatory Visit (HOSPITAL_COMMUNITY): Payer: Self-pay

## 2021-01-28 MED FILL — Ubrogepant Tab 100 MG: ORAL | 30 days supply | Qty: 16 | Fill #1 | Status: AC

## 2021-02-01 ENCOUNTER — Other Ambulatory Visit (HOSPITAL_COMMUNITY): Payer: Self-pay

## 2021-02-13 ENCOUNTER — Other Ambulatory Visit (HOSPITAL_COMMUNITY): Payer: Self-pay

## 2021-02-14 ENCOUNTER — Other Ambulatory Visit (HOSPITAL_COMMUNITY): Payer: Self-pay

## 2021-02-18 ENCOUNTER — Other Ambulatory Visit (HOSPITAL_COMMUNITY): Payer: Self-pay

## 2021-02-22 ENCOUNTER — Other Ambulatory Visit: Payer: Self-pay

## 2021-02-23 ENCOUNTER — Other Ambulatory Visit (HOSPITAL_COMMUNITY): Payer: Self-pay

## 2021-02-23 MED ORDER — MONTELUKAST SODIUM 10 MG PO TABS
ORAL_TABLET | ORAL | 0 refills | Status: DC
  Filled 2021-02-23: qty 90, 90d supply, fill #0

## 2021-03-01 ENCOUNTER — Other Ambulatory Visit: Payer: Self-pay

## 2021-03-01 ENCOUNTER — Other Ambulatory Visit (HOSPITAL_COMMUNITY): Payer: Self-pay

## 2021-03-01 MED ORDER — BUPROPION HCL ER (XL) 150 MG PO TB24
150.0000 mg | ORAL_TABLET | Freq: Every day | ORAL | 0 refills | Status: DC
  Filled 2021-03-01: qty 90, 90d supply, fill #0

## 2021-03-14 ENCOUNTER — Other Ambulatory Visit (HOSPITAL_COMMUNITY): Payer: Self-pay

## 2021-03-15 ENCOUNTER — Other Ambulatory Visit (HOSPITAL_COMMUNITY): Payer: Self-pay

## 2021-03-21 ENCOUNTER — Other Ambulatory Visit (HOSPITAL_COMMUNITY): Payer: Self-pay

## 2021-03-22 NOTE — Telephone Encounter (Signed)
The request has been approved. The authorization is effective for a maximum of 12 fills from 03/22/2021 to 03/21/2022, as long as the member is enrolled in their current health plan. The request was approved with a quantity restriction. This has been approved for a quantity limit of 1 with a day supply limit of 30. A written notification letter will follow with additional details.

## 2021-03-22 NOTE — Telephone Encounter (Addendum)
Completed Emgality PA for renewal. Key: [id number]. Awaiting [employer] determination.

## 2021-03-25 ENCOUNTER — Other Ambulatory Visit (HOSPITAL_COMMUNITY): Payer: Self-pay

## 2021-03-25 MED FILL — Gabapentin Tab 600 MG: ORAL | 90 days supply | Qty: 270 | Fill #0 | Status: AC

## 2021-04-08 ENCOUNTER — Other Ambulatory Visit (HOSPITAL_COMMUNITY): Payer: Self-pay

## 2021-04-08 ENCOUNTER — Other Ambulatory Visit: Payer: Self-pay

## 2021-04-08 MED ORDER — PROGESTERONE MICRONIZED 100 MG PO CAPS
ORAL_CAPSULE | ORAL | 0 refills | Status: DC
  Filled 2021-04-08: qty 90, 90d supply, fill #0

## 2021-04-11 ENCOUNTER — Other Ambulatory Visit (HOSPITAL_COMMUNITY): Payer: Self-pay

## 2021-04-12 ENCOUNTER — Other Ambulatory Visit (HOSPITAL_COMMUNITY): Payer: Self-pay

## 2021-04-12 DIAGNOSIS — Z01419 Encounter for gynecological examination (general) (routine) without abnormal findings: Secondary | ICD-10-CM | POA: Diagnosis not present

## 2021-04-12 DIAGNOSIS — Z6825 Body mass index (BMI) 25.0-25.9, adult: Secondary | ICD-10-CM | POA: Diagnosis not present

## 2021-04-12 DIAGNOSIS — Z760 Encounter for issue of repeat prescription: Secondary | ICD-10-CM | POA: Diagnosis not present

## 2021-04-12 DIAGNOSIS — Z1231 Encounter for screening mammogram for malignant neoplasm of breast: Secondary | ICD-10-CM | POA: Diagnosis not present

## 2021-04-12 DIAGNOSIS — N951 Menopausal and female climacteric states: Secondary | ICD-10-CM | POA: Diagnosis not present

## 2021-04-12 MED ORDER — ESTRADIOL 0.05 MG/24HR TD PTTW
MEDICATED_PATCH | TRANSDERMAL | 4 refills | Status: DC
  Filled 2021-04-12: qty 24, 84d supply, fill #0
  Filled 2021-07-25: qty 24, 84d supply, fill #1
  Filled 2021-11-06: qty 24, 84d supply, fill #2
  Filled 2022-01-30: qty 24, 84d supply, fill #3

## 2021-04-12 MED ORDER — PROGESTERONE MICRONIZED 100 MG PO CAPS
ORAL_CAPSULE | ORAL | 4 refills | Status: DC
  Filled 2021-04-12 – 2021-07-10 (×2): qty 90, 90d supply, fill #0
  Filled 2021-10-07: qty 64, 64d supply, fill #1
  Filled 2021-10-07: qty 26, 26d supply, fill #1
  Filled 2022-01-02: qty 90, 90d supply, fill #2
  Filled 2022-04-03: qty 30, 30d supply, fill #3

## 2021-04-12 MED ORDER — BUPROPION HCL ER (XL) 150 MG PO TB24
150.0000 mg | ORAL_TABLET | Freq: Every day | ORAL | 4 refills | Status: DC
  Filled 2021-04-12 – 2021-06-03 (×2): qty 90, 90d supply, fill #0

## 2021-04-14 ENCOUNTER — Other Ambulatory Visit (HOSPITAL_COMMUNITY): Payer: Self-pay

## 2021-04-20 ENCOUNTER — Other Ambulatory Visit (HOSPITAL_COMMUNITY): Payer: Self-pay

## 2021-04-28 DIAGNOSIS — Z1211 Encounter for screening for malignant neoplasm of colon: Secondary | ICD-10-CM | POA: Diagnosis not present

## 2021-05-03 LAB — COLOGUARD: COLOGUARD: NEGATIVE

## 2021-05-03 LAB — EXTERNAL GENERIC LAB PROCEDURE: COLOGUARD: NEGATIVE

## 2021-05-21 DIAGNOSIS — H5213 Myopia, bilateral: Secondary | ICD-10-CM | POA: Diagnosis not present

## 2021-05-23 ENCOUNTER — Other Ambulatory Visit (HOSPITAL_COMMUNITY): Payer: Self-pay

## 2021-05-25 ENCOUNTER — Other Ambulatory Visit (HOSPITAL_COMMUNITY): Payer: Self-pay

## 2021-05-25 ENCOUNTER — Other Ambulatory Visit: Payer: Self-pay | Admitting: Family Medicine

## 2021-05-25 MED ORDER — MONTELUKAST SODIUM 10 MG PO TABS
ORAL_TABLET | Freq: Every day | ORAL | 0 refills | Status: DC
  Filled 2021-05-25: qty 90, 90d supply, fill #0

## 2021-05-25 NOTE — Progress Notes (Signed)
Pt requests refill of singulair.  Has had difficulty getting through to prescribing provider.

## 2021-05-26 ENCOUNTER — Other Ambulatory Visit (HOSPITAL_COMMUNITY): Payer: Self-pay

## 2021-05-27 ENCOUNTER — Other Ambulatory Visit (HOSPITAL_COMMUNITY): Payer: Self-pay

## 2021-05-27 MED ORDER — EMGALITY 120 MG/ML ~~LOC~~ SOSY
PREFILLED_SYRINGE | SUBCUTANEOUS | 3 refills | Status: DC
  Filled 2021-05-27: qty 1, 28d supply, fill #0
  Filled 2021-06-29: qty 1, 28d supply, fill #1
  Filled 2021-08-01: qty 1, 28d supply, fill #2
  Filled 2021-08-30: qty 1, 28d supply, fill #3

## 2021-05-30 ENCOUNTER — Other Ambulatory Visit (HOSPITAL_COMMUNITY): Payer: Self-pay

## 2021-05-30 MED ORDER — MONTELUKAST SODIUM 10 MG PO TABS
10.0000 mg | ORAL_TABLET | Freq: Every day | ORAL | 3 refills | Status: DC
  Filled 2021-05-30 – 2021-08-21 (×2): qty 90, 90d supply, fill #0
  Filled 2021-11-15: qty 90, 90d supply, fill #1
  Filled 2022-02-17: qty 90, 90d supply, fill #2
  Filled 2022-05-17: qty 90, 90d supply, fill #3

## 2021-06-03 ENCOUNTER — Other Ambulatory Visit (HOSPITAL_COMMUNITY): Payer: Self-pay

## 2021-06-20 ENCOUNTER — Telehealth: Payer: Self-pay | Admitting: Neurology

## 2021-06-21 NOTE — Telephone Encounter (Signed)
Clonazepam refill request. Last filled on 10/18/20 for #30.   Last visit: 06/08/20 Next visit: not scheduled

## 2021-06-22 ENCOUNTER — Other Ambulatory Visit: Payer: Self-pay | Admitting: Neurology

## 2021-06-22 ENCOUNTER — Encounter: Payer: Self-pay | Admitting: Neurology

## 2021-06-22 MED ORDER — BELSOMRA 10 MG PO TABS
10.0000 mg | ORAL_TABLET | Freq: Every evening | ORAL | 4 refills | Status: DC
  Filled 2021-06-22: qty 30, 30d supply, fill #0

## 2021-06-22 NOTE — Telephone Encounter (Addendum)
I called pt and LMVM for her mobile  to return call, may maychart as well. Concerning her refill request for clonazepam.  Given initially back in 2021 4 refills until she found someone else to order (pcp, sleep).

## 2021-06-23 ENCOUNTER — Other Ambulatory Visit: Payer: Self-pay | Admitting: Neurology

## 2021-06-23 ENCOUNTER — Other Ambulatory Visit (HOSPITAL_COMMUNITY): Payer: Self-pay

## 2021-06-23 MED ORDER — CLONAZEPAM 0.5 MG PO TABS
ORAL_TABLET | Freq: Every day | ORAL | 2 refills | Status: DC
  Filled 2021-06-23: qty 30, 30d supply, fill #0

## 2021-06-23 NOTE — Progress Notes (Signed)
Madison Nguyen D.[employer] [place] Phone: [phone]   Assessment and Plan:     1. Greater trochanteric bursitis of right hip -Acute, uncomplicated, initial sports medicine visit - Likely greater trochanteric bursitis based on HPI, physical exam - No red flag symptoms, so no imaging performed today - Patient elected for greater trochanteric CSI.  Tolerated well per note below - Use Tylenol/NSAIDs as needed for pain control - Start HEP for hip flexor, gluteal musculature  Procedure: Greater trochanteric bursal injection Side: right  Risks explained and consent was given verbally. The site was cleaned with alcohol prep. A steroid injection was performed with patient in the lateral side-lying position at area of maximum tenderness over greater trochanter using 58mL of 1% lidocaine without epinephrine and 68mL of kenalog 40mg /ml. This was well tolerated and resulted in symptomatic relief.  Needle was removed, hemostasis achieved, and post injection instructions were explained.  Pt was advised to call or return to clinic if these symptoms worsen or fail to improve as anticipated.   Pertinent previous records reviewed include none   Follow Up: As needed in 3 to 4 weeks if no improvement or worsening of symptoms.  Would consider hip x-ray at that time   Subjective:   I, Madison Nguyen, am serving as a Education administrator for Madison Nguyen Complaint: hip pain   HPI:  06/24/2021 Patient is a 56 year old female complaining of hip pain .Patient states that she thinks she has hip tendinitis, bursitis  no MOI exercises a lot pain on the right lateral hip hx bad knee anterior knee pain , gait is probably off. The past two 2 week has been getting worst does not think its a internal hip pain has family history of lupus , once in a while takes aleve 2 pills twice a day for pain has used Voltaren gel but doesn't do much has some si  joint pain but doesnt think related at all   Relevant Historical Information: SLE not on medication  Additional pertinent review of systems negative.   Current Outpatient Medications:    buPROPion (WELLBUTRIN XL) 150 MG 24 hr tablet, Take 150 mg by mouth daily., Disp: , Rfl:    buPROPion (WELLBUTRIN XL) 150 MG 24 hr tablet, TAKE 1 TABLET BY MOUTH ONCE DAILY, Disp: 90 tablet, Rfl: 4   buPROPion (WELLBUTRIN XL) 150 MG 24 hr tablet, TAKE 1 TABLET BY MOUTH ONCE DAILY, Disp: 90 tablet, Rfl: 4   cetirizine (ZYRTEC) 10 MG tablet, Take 10 mg by mouth daily., Disp: , Rfl:    clonazePAM (KLONOPIN) 0.5 MG tablet, TAKE 1 TABLET BY MOUTH AT BEDTIME, Disp: 30 tablet, Rfl: 2   Diclofenac Potassium,Migraine, 50 MG PACK, Take 50 mg by mouth 3 (three) times daily as needed. Take once daily as needed with headache onset. Please take with food, Disp: 4 each, Rfl: 0   estradiol (VIVELLE-DOT) 0.05 MG/24HR patch, Apply 1 patch topically twice a week, Disp: 8 patch, Rfl: 11   estradiol (VIVELLE-DOT) 0.05 MG/24HR patch, Apply 1 patch twice a week by transdermal route., Disp: 24 patch, Rfl: 4   gabapentin (NEURONTIN) 600 MG tablet, TAKE 1 TABLET BY MOUTH 3 TIMES DAILY, Disp: 270 tablet, Rfl: 3   Galcanezumab-gnlm (EMGALITY) 120 MG/ML SOAJ, Inject 120 mg into the skin every 30 (thirty) days., Disp: 1 mL, Rfl: 11   Galcanezumab-gnlm (EMGALITY) 120 MG/ML SOSY, Inject 120 mg into the skin every 30  days, Disp: 1 mL, Rfl: 3   MAGNESIUM PO, Take 500 mg by mouth at bedtime., Disp: , Rfl:    montelukast (SINGULAIR) 10 MG tablet, Take 10 mg by mouth at bedtime., Disp: , Rfl:    montelukast (SINGULAIR) 10 MG tablet, TAKE 1 TABLET BY MOUTH ONCE A DAY, Disp: 90 tablet, Rfl: 0   montelukast (SINGULAIR) 10 MG tablet, Take 1 tablet by mouth daily, Disp: 90 tablet, Rfl: 0   montelukast (SINGULAIR) 10 MG tablet, TAKE 1 TABLET BY MOUTH ONCE DAILY, Disp: 90 tablet, Rfl: 3   progesterone (PROMETRIUM) 100 MG capsule, TAKE 1 CAPSULE BY  MOUTH AT BEDTIME, Disp: 90 capsule, Rfl: 4   progesterone (PROMETRIUM) 100 MG capsule, Take 1 capsule by mouth daily, Disp: 90 capsule, Rfl: 0   progesterone (PROMETRIUM) 100 MG capsule, Take 1 capsule(s) every day by oral route at bedtime., Disp: 90 capsule, Rfl: 4   rizatriptan (MAXALT) 10 MG tablet, Take 10 mg by mouth as needed for migraine. , Disp: , Rfl:    Suvorexant (BELSOMRA) 10 MG TABS, Take 10 mg by mouth at bedtime., Disp: 30 tablet, Rfl: 4   Ubrogepant 100 MG TABS, TAKE 1 TABLET BY MOUTH EVERY 2 HOURS AS NEEDED. MAX 2 TABLETS A DAY., Disp: 16 tablet, Rfl: 11   Objective:     Vitals:   06/24/21 0952  BP: 110/60  Pulse: (!) 54  SpO2: 98%  Weight: 146 lb (66.2 kg)  Height: 5\' 3"  (1.6 m)      Body mass index is 25.86 kg/m.    Physical Exam:    General: awake, alert, and oriented no acute distress, nontoxic Skin: no suspicious lesions or rashes Neuro:sensation intact distally with no dificits, normal muscle tone, no atrophy, strength 5/5 in all tested lower ext groups Psych: normal mood and affect, speech clear  Right hip: No deformity, swelling or wasting ROM Fexion 90, ext 30, IR 40, ER 45 TTP moderate hip flexors, greater trochanter, and mild gluteal musculature, SI joint NTTP  lumbar spine Negative log roll with FROM Negative FABER Positive FADIR with gluteal musculature tightness and discomfort.  No groin pain Bilaterally tight hamstrings Gait normal    Electronically signed by:  Madison Nguyen D.[facility] 10:26 AM 06/24/21

## 2021-06-23 NOTE — Progress Notes (Signed)
Patient requested temporary refill to help her sleep during a difficult time. I tried Belsomra, it appears to be in a PA process. Will refill her Clonazepam temporarily as needed.

## 2021-06-24 ENCOUNTER — Other Ambulatory Visit: Payer: Self-pay

## 2021-06-24 ENCOUNTER — Other Ambulatory Visit (HOSPITAL_COMMUNITY): Payer: Self-pay

## 2021-06-24 ENCOUNTER — Ambulatory Visit: Payer: 59 | Admitting: Sports Medicine

## 2021-06-24 VITALS — BP 110/60 | HR 54 | Ht 63.0 in | Wt 146.0 lb

## 2021-06-24 DIAGNOSIS — M7061 Trochanteric bursitis, right hip: Secondary | ICD-10-CM

## 2021-06-24 NOTE — Patient Instructions (Addendum)
Good to see you  Patellar femoral and hip flexor HEP As needed follow up , 3-4 week follow up if still not feeling better

## 2021-06-30 ENCOUNTER — Other Ambulatory Visit (HOSPITAL_COMMUNITY): Payer: Self-pay

## 2021-07-11 ENCOUNTER — Other Ambulatory Visit (HOSPITAL_COMMUNITY): Payer: Self-pay

## 2021-07-25 ENCOUNTER — Other Ambulatory Visit (HOSPITAL_COMMUNITY): Payer: Self-pay

## 2021-08-01 ENCOUNTER — Other Ambulatory Visit (HOSPITAL_COMMUNITY): Payer: Self-pay

## 2021-08-21 ENCOUNTER — Other Ambulatory Visit (HOSPITAL_COMMUNITY): Payer: Self-pay

## 2021-08-22 ENCOUNTER — Other Ambulatory Visit (HOSPITAL_COMMUNITY): Payer: Self-pay

## 2021-08-30 ENCOUNTER — Other Ambulatory Visit (HOSPITAL_COMMUNITY): Payer: Self-pay

## 2021-10-07 ENCOUNTER — Other Ambulatory Visit (HOSPITAL_COMMUNITY): Payer: Self-pay

## 2021-10-07 ENCOUNTER — Other Ambulatory Visit: Payer: Self-pay | Admitting: Family Medicine

## 2021-10-07 MED ORDER — RIZATRIPTAN BENZOATE 10 MG PO TABS
10.0000 mg | ORAL_TABLET | ORAL | 3 refills | Status: DC | PRN
  Filled 2021-10-07: qty 10, 30d supply, fill #0

## 2021-10-07 NOTE — Telephone Encounter (Signed)
duplicate

## 2021-10-19 ENCOUNTER — Other Ambulatory Visit (HOSPITAL_COMMUNITY): Payer: Self-pay

## 2021-10-19 ENCOUNTER — Other Ambulatory Visit: Payer: Self-pay | Admitting: Neurology

## 2021-10-19 DIAGNOSIS — G43711 Chronic migraine without aura, intractable, with status migrainosus: Secondary | ICD-10-CM

## 2021-10-19 MED ORDER — GABAPENTIN 600 MG PO TABS
ORAL_TABLET | Freq: Three times a day (TID) | ORAL | 3 refills | Status: DC
  Filled 2021-10-19: qty 270, 90d supply, fill #0

## 2021-10-19 MED ORDER — RIZATRIPTAN BENZOATE 10 MG PO TABS
10.0000 mg | ORAL_TABLET | ORAL | 11 refills | Status: DC | PRN
  Filled 2021-10-19 – 2021-11-15 (×2): qty 10, 30d supply, fill #0
  Filled 2022-03-23: qty 10, 30d supply, fill #1

## 2021-10-19 MED ORDER — EMGALITY 120 MG/ML ~~LOC~~ SOAJ
120.0000 mg | SUBCUTANEOUS | 11 refills | Status: DC
  Filled 2021-10-19: qty 1, 30d supply, fill #0
  Filled 2021-11-15: qty 1, 30d supply, fill #1
  Filled 2021-12-23: qty 1, 30d supply, fill #2
  Filled 2022-01-18: qty 1, 30d supply, fill #3
  Filled 2022-02-17: qty 1, 30d supply, fill #4
  Filled 2022-03-22: qty 1, 30d supply, fill #5

## 2021-11-04 ENCOUNTER — Other Ambulatory Visit (HOSPITAL_COMMUNITY): Payer: Self-pay

## 2021-11-04 ENCOUNTER — Encounter: Payer: Self-pay | Admitting: Family Medicine

## 2021-11-04 ENCOUNTER — Ambulatory Visit (INDEPENDENT_AMBULATORY_CARE_PROVIDER_SITE_OTHER): Payer: 59 | Admitting: Family Medicine

## 2021-11-04 VITALS — BP 118/78 | HR 77 | Temp 97.5°F | Ht 63.0 in | Wt 148.4 lb

## 2021-11-04 DIAGNOSIS — M329 Systemic lupus erythematosus, unspecified: Secondary | ICD-10-CM | POA: Insufficient documentation

## 2021-11-04 DIAGNOSIS — R4586 Emotional lability: Secondary | ICD-10-CM

## 2021-11-04 DIAGNOSIS — F5104 Psychophysiologic insomnia: Secondary | ICD-10-CM | POA: Diagnosis not present

## 2021-11-04 DIAGNOSIS — Z79890 Hormone replacement therapy (postmenopausal): Secondary | ICD-10-CM | POA: Diagnosis not present

## 2021-11-04 DIAGNOSIS — F419 Anxiety disorder, unspecified: Secondary | ICD-10-CM | POA: Diagnosis not present

## 2021-11-04 DIAGNOSIS — G43709 Chronic migraine without aura, not intractable, without status migrainosus: Secondary | ICD-10-CM | POA: Diagnosis not present

## 2021-11-04 MED ORDER — BUSPIRONE HCL 7.5 MG PO TABS
7.5000 mg | ORAL_TABLET | Freq: Two times a day (BID) | ORAL | 6 refills | Status: DC
  Filled 2021-11-04: qty 60, 30d supply, fill #0

## 2021-11-04 MED ORDER — MAGNESIUM 500 MG PO TABS: 1.0000 | ORAL_TABLET | Freq: Every day | ORAL | Status: AC

## 2021-11-04 NOTE — Assessment & Plan Note (Addendum)
Managed by neurology on gabapentin, emgality, magnesium as well as PRN maxalt.  Sensitive to many medications as they can worsen migraines.

## 2021-11-04 NOTE — Assessment & Plan Note (Signed)
Continues estradiol patch twice weekly with daily prometrium through GYN.

## 2021-11-04 NOTE — Patient Instructions (Addendum)
Start buspar 7.'5mg'$  twice daily as needed.  Good to see you today!  Keep me updated with how you're doing!

## 2021-11-04 NOTE — Progress Notes (Signed)
Patient ID: Madison Greenspan, MD, female    DOB: 1965/06/08, 56 y.o.   MRN: [id number]  This visit was conducted in person.  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  Previously saw Dr Council Mechanic who has since retired.   Struggling with post-menopausal symptoms of hot flashes, worsening migraines, worsening insomnia, and now severe mood swings - cries easily without cause. Noted over last 4 months. Longstanding h/o anxiety, has seen EAP as well as several counselors. Counseling previously somewhat helpful. Continues exercising regularly, does meditation and "CALM" app.   On estradiol patch 0.'05mg'$  twice weekly and prometrium '100mg'$  daily through GYN Dr Helane Rima. Started a few years ago.   Severe migraines - followed by neurology Dr Jaynee Eagles, currently on gabapentin '600mg'$  BID and emgality monthly, as well as magnesium daily. Takes maxalt '10mg'$  PRN abortive treatment. Also on singulair and zyrtec for allergies/headache. Triggers are stress let down - after stressful day.   Chronic insomnia managed with klonopin 0.'5mg'$  PRN nightly. Has previously seen sleep clinic.   Son Max just graduated from [employer] - going to [employer] next year - he'll be staying on campus in [place].  Parents passed away suddenly over the past year.  In-laws not doing well in [place].  Husband Corene Cornea started new job at [employer].   SLE - quiescent, untreated. Last ANA high remotely. Has not seen rheumatologist.  Mother with h/o SLE.      Relevant past medical, surgical, family and social history reviewed and updated as indicated. Interim medical history since our last visit reviewed. Allergies and medications reviewed and updated. Outpatient Medications  Prior to Visit  Medication Sig Dispense Refill   cetirizine (ZYRTEC) 10 MG tablet Take 10 mg by mouth daily.     clonazePAM (KLONOPIN) 0.5 MG tablet TAKE 1 TABLET BY MOUTH AT BEDTIME (Patient taking differently: Take by mouth at bedtime. As needed) 30 tablet 2   estradiol (VIVELLE-DOT) 0.05 MG/24HR patch Apply 1 patch twice a week by transdermal route. 24 patch 4   gabapentin (NEURONTIN) 600 MG tablet TAKE 1 TABLET BY MOUTH 3 TIMES DAILY 270 tablet 3   Galcanezumab-gnlm (EMGALITY) 120 MG/ML SOAJ Inject 120 mg into the skin every 30 days. 1 mL 11   montelukast (SINGULAIR) 10 MG tablet TAKE 1 TABLET BY MOUTH ONCE DAILY 90 tablet 3   progesterone (PROMETRIUM) 100 MG capsule Take 1 capsule by mouth every day at bedtime. 90 capsule 4   rizatriptan (MAXALT) 10 MG tablet Take 1 tablet by mouth as needed for migraine. 10 tablet 11   MAGNESIUM PO Take 500 mg by mouth at bedtime.     buPROPion (WELLBUTRIN XL) 150 MG 24 hr tablet Take 150 mg by mouth daily.     buPROPion (WELLBUTRIN XL) 150 MG 24 hr tablet TAKE 1 TABLET BY MOUTH ONCE DAILY 90 tablet 4   estradiol (VIVELLE-DOT) 0.05 MG/24HR patch Apply 1 patch topically twice a week 8 patch 11   Galcanezumab-gnlm (EMGALITY) 120 MG/ML SOSY Inject 120 mg into the skin every 30 days 1 mL 3  montelukast (SINGULAIR) 10 MG tablet Take 10 mg by mouth at bedtime.     montelukast (SINGULAIR) 10 MG tablet TAKE 1 TABLET BY MOUTH ONCE A DAY 90 tablet 0   montelukast (SINGULAIR) 10 MG tablet Take 1 tablet by mouth daily 90 tablet 0   progesterone (PROMETRIUM) 100 MG capsule TAKE 1 CAPSULE BY MOUTH AT BEDTIME 90 capsule 4   progesterone (PROMETRIUM) 100 MG capsule Take 1 capsule by mouth daily 90 capsule 0   Suvorexant (BELSOMRA) 10 MG TABS Take 10 mg by mouth at bedtime. 30 tablet 4   No facility-administered medications prior to visit.     Per HPI unless specifically indicated in ROS section below Review of Systems  Objective:  BP 118/78   Pulse 77   Temp  (!) 97.5 F (36.4 C) (Temporal)   Ht '5\' 3"'$  (1.6 m)   Wt 148 lb 6 oz (67.3 kg)   SpO2 100%   BMI 26.28 kg/m   Wt Readings from Last 3 Encounters:  11/04/21 148 lb 6 oz (67.3 kg)  06/24/21 146 lb (66.2 kg)  03/25/20 144 lb (65.3 kg)      Physical Exam Vitals and nursing note reviewed.  Constitutional:      Appearance: Normal appearance. She is not ill-appearing.  HENT:     Head: Normocephalic and atraumatic.     Mouth/Throat:     Mouth: Mucous membranes are moist.     Pharynx: Oropharynx is clear. No oropharyngeal exudate or posterior oropharyngeal erythema.  Eyes:     Extraocular Movements: Extraocular movements intact.     Pupils: Pupils are equal, round, and reactive to light.  Neck:     Thyroid: No thyroid mass, thyromegaly or thyroid tenderness.  Cardiovascular:     Rate and Rhythm: Normal rate and regular rhythm.     Pulses: Normal pulses.     Heart sounds: Normal heart sounds. No murmur heard. Pulmonary:     Effort: Pulmonary effort is normal. No respiratory distress.     Breath sounds: Normal breath sounds. No wheezing, rhonchi or rales.  Musculoskeletal:     Cervical back: Normal range of motion and neck supple.     Right lower leg: No edema.     Left lower leg: No edema.     Comments: 2+ rad pulses bilaterally  Skin:    General: Skin is warm and dry.     Findings: No rash.  Neurological:     Mental Status: She is alert.  Psychiatric:        Mood and Affect: Mood normal.        Behavior: Behavior normal.       Results for orders placed or performed in visit on 02/25/19  Lipid panel  Result Value Ref Range   Cholesterol 174 <200 mg/dL   HDL 69 > OR = 50 mg/dL   Triglycerides 128 <150 mg/dL   LDL Cholesterol (Calc) 83 mg/dL (calc)   Total CHOL/HDL Ratio 2.5 <5.0 (calc)   Non-HDL Cholesterol (Calc) 105 <130 mg/dL (calc)  COMPLETE METABOLIC PANEL WITH GFR  Result Value Ref Range   Glucose, Bld 83 65 - 139 mg/dL   BUN 11 7 - 25 mg/dL   Creat 1.00 0.50  - 1.05 mg/dL   GFR, Est Non African American 65 > OR = 60 mL/min/1.44m   GFR, Est African American 76 > OR = 60 mL/min/1.79m  BUN/Creatinine Ratio NOT APPLICABLE 6 - 22 (calc)   Sodium 136 135 -  146 mmol/L   Potassium 4.0 3.5 - 5.3 mmol/L   Chloride 104 98 - 110 mmol/L   CO2 20 20 - 32 mmol/L   Calcium 9.4 8.6 - 10.4 mg/dL   Total Protein 6.5 6.1 - 8.1 g/dL   Albumin 4.0 3.6 - 5.1 g/dL   Globulin 2.5 1.9 - 3.7 g/dL (calc)   AG Ratio 1.6 1.0 - 2.5 (calc)   Total Bilirubin 0.6 0.2 - 1.2 mg/dL   Alkaline phosphatase (APISO) 45 37 - 153 U/L   AST 17 10 - 35 U/L   ALT 14 6 - 29 U/L  TSH  Result Value Ref Range   TSH 1.62 mIU/L  CBC with Differential/Platelet  Result Value Ref Range   WBC 7.5 3.8 - 10.8 Thousand/uL   RBC 4.69 3.80 - 5.10 Million/uL   Hemoglobin 14.2 11.7 - 15.5 g/dL   HCT 42.1 35.0 - 45.0 %   MCV 89.8 80.0 - 100.0 fL   MCH 30.3 27.0 - 33.0 pg   MCHC 33.7 32.0 - 36.0 g/dL   RDW 12.1 11.0 - 15.0 %   Platelets 172 140 - 400 Thousand/uL   MPV 13.7 (H) 7.5 - 12.5 fL   Neutro Abs 5,768 1,500 - 7,800 cells/uL   Lymphs Abs 1,155 850 - 3,900 cells/uL   Absolute Monocytes 450 200 - 950 cells/uL   Eosinophils Absolute 68 15 - 500 cells/uL   Basophils Absolute 60 0 - 200 cells/uL   Neutrophils Relative % 76.9 %   Total Lymphocyte 15.4 %   Monocytes Relative 6.0 %   Eosinophils Relative 0.9 %   Basophils Relative 0.8 %    Assessment & Plan:   Problem List Items Addressed This Visit     Chronic migraine without aura without status migrainosus, not intractable    Managed by neurology on gabapentin, emgality, magnesium as well as PRN maxalt.  Sensitive to many medications as they can worsen migraines.       Post-menopause on HRT (hormone replacement therapy)    Continues estradiol patch twice weekly with daily prometrium through GYN.       Anxiety    Longstanding h/o this, previously well managed with exercise, mindfulness, previously has seeked counseling with  benefit. Acutely worse over the past several months, unclear trigger although has had significant family stressors over the past 1-2 years. Mood swings seem to be driven by worsened anxiety.  Discussed treatment options including SSRI/SNRI, prn benzo, and buspar. Reasonable to try buspar - will start 7.'5mg'$  BID PRN. Reviewed most common side effects of dizziness/drowsiness, worse at higher doses. Update with effect.       Relevant Medications   busPIRone (BUSPAR) 7.5 MG tablet   Chronic insomnia    Lifelong, currently off regular treatment.       SLE (systemic lupus erythematosus) ([place])    Quiescent period, does not see rheum but monitors yearly through Doc-day labs.       Other Visit Diagnoses     Mood swings    -  Primary        Meds ordered this encounter  Medications   Magnesium 500 MG TABS    Sig: Take 1 tablet (500 mg total) by mouth at bedtime.   busPIRone (BUSPAR) 7.5 MG tablet    Sig: Take 1 tablet (7.5 mg total) by mouth 2 (two) times daily.    Dispense:  60 tablet    Refill:  6   No orders of the defined types were  placed in this encounter.    Patient Instructions  Start buspar 7.'5mg'$  twice daily as needed.  Good to see you today!  Keep me updated with how you're doing!  Follow up plan: Return if symptoms worsen or fail to improve.  Ria Bush, MD

## 2021-11-04 NOTE — Assessment & Plan Note (Addendum)
Longstanding h/o this, previously well managed with exercise, mindfulness, previously has seeked counseling with benefit. Acutely worse over the past several months, unclear trigger although has had significant family stressors over the past 1-2 years. Mood swings seem to be driven by worsened anxiety.  Discussed treatment options including SSRI/SNRI, prn benzo, and buspar. Reasonable to try buspar - will start 7.'5mg'$  BID PRN. Reviewed most common side effects of dizziness/drowsiness, worse at higher doses. Update with effect.

## 2021-11-04 NOTE — Assessment & Plan Note (Signed)
Quiescent period, does not see rheum but monitors yearly through Doc-day labs.

## 2021-11-04 NOTE — Assessment & Plan Note (Signed)
Lifelong, currently off regular treatment.

## 2021-11-07 ENCOUNTER — Other Ambulatory Visit (HOSPITAL_COMMUNITY): Payer: Self-pay

## 2021-11-08 ENCOUNTER — Other Ambulatory Visit (HOSPITAL_COMMUNITY): Payer: Self-pay

## 2021-11-15 ENCOUNTER — Other Ambulatory Visit (HOSPITAL_COMMUNITY): Payer: Self-pay

## 2021-11-24 ENCOUNTER — Other Ambulatory Visit: Payer: Self-pay | Admitting: Family Medicine

## 2021-11-24 ENCOUNTER — Other Ambulatory Visit (HOSPITAL_COMMUNITY): Payer: Self-pay

## 2021-11-24 MED ORDER — BUSPIRONE HCL 7.5 MG PO TABS
7.5000 mg | ORAL_TABLET | Freq: Two times a day (BID) | ORAL | 3 refills | Status: DC
  Filled 2021-11-24: qty 180, 90d supply, fill #0
  Filled 2022-04-16: qty 180, 90d supply, fill #1

## 2021-11-24 NOTE — Progress Notes (Signed)
Buspar 7.'5mg'$  bid helping - will send 90d supply per Dr Marliss Coots request.

## 2021-11-25 ENCOUNTER — Other Ambulatory Visit (HOSPITAL_COMMUNITY): Payer: Self-pay

## 2021-12-23 ENCOUNTER — Other Ambulatory Visit (HOSPITAL_COMMUNITY): Payer: Self-pay

## 2022-01-02 ENCOUNTER — Other Ambulatory Visit (HOSPITAL_COMMUNITY): Payer: Self-pay

## 2022-01-18 ENCOUNTER — Other Ambulatory Visit (HOSPITAL_COMMUNITY): Payer: Self-pay

## 2022-01-30 ENCOUNTER — Other Ambulatory Visit (HOSPITAL_COMMUNITY): Payer: Self-pay

## 2022-02-01 ENCOUNTER — Other Ambulatory Visit (HOSPITAL_COMMUNITY): Payer: Self-pay

## 2022-02-02 ENCOUNTER — Other Ambulatory Visit (HOSPITAL_COMMUNITY): Payer: Self-pay

## 2022-02-17 ENCOUNTER — Other Ambulatory Visit (HOSPITAL_COMMUNITY): Payer: Self-pay

## 2022-03-22 ENCOUNTER — Other Ambulatory Visit (HOSPITAL_COMMUNITY): Payer: Self-pay

## 2022-03-23 ENCOUNTER — Other Ambulatory Visit (HOSPITAL_COMMUNITY): Payer: Self-pay

## 2022-03-27 ENCOUNTER — Other Ambulatory Visit (HOSPITAL_COMMUNITY): Payer: Self-pay

## 2022-03-29 ENCOUNTER — Other Ambulatory Visit (HOSPITAL_COMMUNITY): Payer: Self-pay

## 2022-03-30 ENCOUNTER — Encounter: Payer: Self-pay | Admitting: *Deleted

## 2022-03-30 NOTE — Telephone Encounter (Signed)
[address] ([place]) Rx #: [id number] Emgality '120MG'$ /ML auto-injectors (migraine)    The request has been approved. The authorization is effective from 03/30/2022 to 03/30/2023, as long as the member is enrolled in their current health plan. The request was approved with a quantity restriction. This has been approved for a max daily dosage of 0.03. A written notification letter will follow with additional details.   Sent mychart message to make patient aware

## 2022-04-03 ENCOUNTER — Other Ambulatory Visit (HOSPITAL_COMMUNITY): Payer: Self-pay

## 2022-04-04 ENCOUNTER — Other Ambulatory Visit (HOSPITAL_COMMUNITY): Payer: Self-pay

## 2022-04-13 ENCOUNTER — Other Ambulatory Visit (HOSPITAL_COMMUNITY): Payer: Self-pay

## 2022-04-13 DIAGNOSIS — Z6825 Body mass index (BMI) 25.0-25.9, adult: Secondary | ICD-10-CM | POA: Diagnosis not present

## 2022-04-13 DIAGNOSIS — Z01419 Encounter for gynecological examination (general) (routine) without abnormal findings: Secondary | ICD-10-CM | POA: Diagnosis not present

## 2022-04-13 DIAGNOSIS — N951 Menopausal and female climacteric states: Secondary | ICD-10-CM | POA: Diagnosis not present

## 2022-04-13 DIAGNOSIS — Z1231 Encounter for screening mammogram for malignant neoplasm of breast: Secondary | ICD-10-CM | POA: Diagnosis not present

## 2022-04-13 MED ORDER — PROGESTERONE MICRONIZED 100 MG PO CAPS
100.0000 mg | ORAL_CAPSULE | Freq: Every day | ORAL | 4 refills | Status: DC
  Filled 2022-04-13 – 2022-05-05 (×3): qty 90, 90d supply, fill #0
  Filled 2022-07-29: qty 90, 90d supply, fill #1
  Filled 2022-10-29: qty 90, 90d supply, fill #2
  Filled 2023-01-26: qty 90, 90d supply, fill #3

## 2022-04-13 MED ORDER — ESTRADIOL 0.05 MG/24HR TD PTTW
MEDICATED_PATCH | TRANSDERMAL | 4 refills | Status: DC
  Filled 2022-04-13: qty 24, 84d supply, fill #0
  Filled 2022-07-29: qty 24, 84d supply, fill #1
  Filled 2022-11-12: qty 24, 84d supply, fill #2
  Filled 2023-04-02: qty 24, 84d supply, fill #3

## 2022-04-13 MED ORDER — ESTRADIOL 0.05 MG/24HR TD PTTW
1.0000 | MEDICATED_PATCH | TRANSDERMAL | 4 refills | Status: DC
  Filled 2022-04-13: qty 8, 28d supply, fill #0

## 2022-04-16 ENCOUNTER — Other Ambulatory Visit (HOSPITAL_COMMUNITY): Payer: Self-pay

## 2022-04-17 ENCOUNTER — Other Ambulatory Visit (HOSPITAL_COMMUNITY): Payer: Self-pay

## 2022-04-27 ENCOUNTER — Other Ambulatory Visit (HOSPITAL_COMMUNITY): Payer: Self-pay

## 2022-04-27 ENCOUNTER — Encounter: Payer: Self-pay | Admitting: Neurology

## 2022-04-27 ENCOUNTER — Ambulatory Visit: Payer: 59 | Admitting: Neurology

## 2022-04-27 DIAGNOSIS — G43711 Chronic migraine without aura, intractable, with status migrainosus: Secondary | ICD-10-CM | POA: Diagnosis not present

## 2022-04-27 MED ORDER — EMGALITY 120 MG/ML ~~LOC~~ SOAJ
120.0000 mg | SUBCUTANEOUS | 11 refills | Status: DC
  Filled 2022-04-27: qty 3, 90d supply, fill #0
  Filled 2022-07-29: qty 3, 90d supply, fill #1
  Filled 2022-10-29 – 2022-11-01 (×2): qty 3, 90d supply, fill #2
  Filled 2023-01-26: qty 3, 90d supply, fill #3
  Filled 2023-04-27: qty 3, 90d supply, fill #4

## 2022-04-27 MED ORDER — TRAZODONE HCL 100 MG PO TABS
100.0000 mg | ORAL_TABLET | Freq: Every day | ORAL | 6 refills | Status: DC
  Filled 2022-04-27: qty 180, 90d supply, fill #0

## 2022-04-27 MED ORDER — NURTEC 75 MG PO TBDP: 75.0000 mg | ORAL_TABLET | Freq: Every day | ORAL | 11 refills | Status: DC | PRN

## 2022-04-27 MED ORDER — GABAPENTIN 600 MG PO TABS
ORAL_TABLET | Freq: Three times a day (TID) | ORAL | 3 refills | Status: DC
  Filled 2022-04-27: qty 270, 90d supply, fill #0

## 2022-04-27 MED ORDER — RIZATRIPTAN BENZOATE 10 MG PO TABS
10.0000 mg | ORAL_TABLET | ORAL | 11 refills | Status: DC | PRN
  Filled 2022-04-27: qty 18, 54d supply, fill #0
  Filled 2022-12-18: qty 18, 54d supply, fill #1
  Filled 2023-04-16: qty 18, 54d supply, fill #2

## 2022-04-27 NOTE — Progress Notes (Signed)
[facility]    Provider:  Dr Jaynee Eagles Requesting Provider: Ria Bush, MD Primary Care Provider:  Ria Bush, MD  CC:  Chronic migraines  04/27/2022: Emgality has been spectacular. She tried Ajovy. Emgality works better. Emgality and Gabapentin. Up to 5 or 6 migraines  month. Will give her trazodone at bedtime. Will also try nurtec. Maxalt acutely. Discussed. She has one child in college for films and she is very happy. He is in [employer].   Patient complains of symptoms per HPI as well as the following symptoms: migraine . Pertinent negatives and positives per HPI. 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  I reviewed prior neurology notes, patient was at the [facility] with Dr. Sima Matas, she reported her headaches unilateral but can be either side frontal and temporal, lasting 4 to 12 hours or all day, throbbing, sharp and dull, sensitivity to light, noise and worse with movement activities.  She has a rare aura of jagged lines, triggers include allergies, alcohol, fatigue, weather, wearing, medications, hormone changes and emotional stress.  Last being seen November 2020 she was doing well, taking gabapentin 3 times daily working well, as needed, patient's headaches related to hormone fluctuations, hysterectomy in November 2018, she continues to take oral contraceptives to help with hormone levels, rarely takes Seroquel for relief during severe headaches, she also has had success taking Aleve and Robaxin together, she has not needed Maxalt recently, doing well with clonazepam, doing excellent with exercise.  Patient is here and she endorses over a year of severe headaches, over 15 migraine days a month, unilateral, throbbing, pulsating, nausea, photophobia, phonophobia, no medication overuse, no aura, they can last 24 to 72 hours.She uses seroquel very sparingly  because it makes her sad the next day. She takes phenergan every once in a while. She didn;t like nurtec it made her feel weired, she is on very high dose burth control but she is having a hard time coming off of it. She gets little headaches twice a week like today with the weather maybe 2 decent headaches/migraines in a month, alleve and caffeine and water, being tired and exercising too much can trigger.  We had a very long discussion about options, the new technologies, the G pants and CGRP's, combinations of medications, Botox for migraines, oral medications, lifestyle factors, menstrual migraines and hormone fluctuations affecting migraines.  Spent an extended amount of time with patient discussing with her.  Reviewed notes, labs and imaging from outside physicians, which showed: List of medications patient has tried   ANALGESICS: Excedrin and Tylenol/Acetaminophen.  Anti-Migraine: Axert, Frova/Frovatriptan, Maxalt, Treximet and Zomig tablets/Zolmitriptan. Heart/BP: Lopressor/Toprol/Metoprolol and Tenormin/Atenolol.  Decongestant/Antihistamine: Flonase.  Anti-Nauseant: Phenergan/Promethazine.  NSAID's: Celebrex and Mobic/Meloxicam.  Muscle Relaxants: Flexeril/Cyclobenzaprine.  Anti-Convulsants: Topamax/Topiramate, Gabapentin  Steriods: Prednisone.  Sleeping Pills/Tranquilizers: Ambien/Cr/Zolipem, Lunesta/Eszopiclone, Seroquel/Quetiapine and Sonata/Zaleplon.  Anti-Depressants: Elavil/Amitriptylline and Sinequan/Doxepin.  Hormonal: Alesse and Yasmine.  Other medications/therapies: Trigger Point injections   Maxalt, gabapentin, nurtec  Review of Systems: Patient complains of symptoms per HPI as well as the following symptoms: Migraines. Pertinent negatives and positives per HPI. All others negative.   Social History   Socioeconomic History   Marital status: Married    Spouse name: Not on file   Number of children: 1   Years of education: Not on file   Highest education level:  Professional school degree (e.g., MD, DDS, DVM, JD)  Occupational History   Not on file  Tobacco Use   Smoking status: Never   Smokeless tobacco: Never  Vaping Use   Vaping Use: Never used  Substance and Sexual Activity   Alcohol use: Yes    Comment: 2-3 times per week   Drug use: No   Sexual activity: Yes    Birth control/protection: Pill  Other Topics Concern   Not on file  Social History Narrative   Lives with husband, son   Caffeine-1 coffee daily   Right Handed   Social Determinants of Health   Financial Resource Strain: Not on file  Food Insecurity: Not on file  Transportation Needs: Not on file  Physical Activity: Not on file  Stress: Not on file  Social Connections: Not on file  Intimate Partner Violence: Not on file    Family History  Problem Relation Age of Onset   Lupus Mother    Kidney failure Mother    Hypertension Mother    Heart failure Mother    Alcoholism Mother    Alcoholism Father    Pancreatic cancer Father    Breast cancer Paternal Aunt     Past Medical History:  Diagnosis Date   Allergic rhinitis    Anxiety    Asthma    childhood   Gallstones    GERD (gastroesophageal reflux disease)    Insomnia    MENST MIGRAINE W/INTRACT W/O STATUS MIGRAINOSUS 07/16/2007   Qualifier: Diagnosis of  By: Council Mechanic MD, Hilaria Ota    SLE (systemic lupus erythematosus) ([place])    no medication at this time    Patient Active Problem List   Diagnosis Date Noted   Post-menopause on HRT (hormone replacement therapy) 11/04/2021   Anxiety 11/04/2021   Chronic insomnia 11/04/2021   SLE (systemic lupus erythematosus) ([place]) 11/04/2021   Chronic migraine without aura without status migrainosus, not intractable 03/29/2020   Fibroids 04/04/2017   Rotator cuff syndrome of right shoulder 01/21/2017   Pain in both knees 03/09/2016   Cholelithiasis 10/10/2013    Past Surgical History:  Procedure Laterality Date   ABDOMINAL HYSTERECTOMY N/A 04/04/2017    Procedure: HYSTERECTOMY ABDOMINAL;  Surgeon: Dian Queen, MD;  Location: [facility];  Service: Gynecology;  Laterality: N/A;   BILATERAL SALPINGECTOMY Bilateral 04/04/2017   Procedure: BILATERAL SALPINGECTOMY;  Surgeon: Dian Queen, MD;  Location: [facility];  Service: Gynecology;  Laterality: Bilateral;   BREAST SURGERY  2010   reduction-bilat   CHOLECYSTECTOMY N/A 11/12/2013   Procedure: LAPAROSCOPIC CHOLECYSTECTOMY;  Surgeon: Joyice Faster. Corn[place], MD;  Location: [place];  Service: General;  Laterality: N/A;   LAPAROSCOPIC CHOLECYSTECTOMY  10/2013   ([place])   TONSILLECTOMY     URETHRAL DILATION      Current Outpatient Medications  Medication Sig Dispense Refill   busPIRone (BUSPAR) 7.5 MG tablet Take 1 tablet by mouth 2 times daily. 180 tablet 3   cetirizine (ZYRTEC) 10 MG tablet Take 10 mg by mouth daily.     estradiol (VIVELLE-DOT) 0.05 MG/24HR patch Apply 1 patch twice a week 24 patch 4   Magnesium 500 MG TABS Take 1 tablet (500 mg total) by mouth at bedtime.     montelukast (SINGULAIR) 10 MG tablet TAKE 1 TABLET BY MOUTH ONCE DAILY 90 tablet 3   progesterone (PROMETRIUM) 100 MG capsule Take 1 capsule (100 mg total) by mouth at bedtime. 90 capsule 4   Rimegepant Sulfate (NURTEC) 75 MG TBDP Take 75 mg by mouth daily as needed. For migraines.  Take as close to onset of migraine as possible. One daily maximum. 16 tablet 11   traZODone (DESYREL) 100 MG tablet Take 1-2 tablets (100-200 mg total) by mouth at bedtime. 180 tablet 6   clonazePAM (KLONOPIN) 0.5 MG tablet TAKE 1 TABLET BY MOUTH AT BEDTIME (Patient taking differently: Take by mouth at bedtime. As needed) 30 tablet 2   gabapentin (NEURONTIN) 600 MG tablet TAKE 1 TABLET BY MOUTH 3 TIMES DAILY 270 tablet 3   Galcanezumab-gnlm (EMGALITY) 120 MG/ML SOAJ Inject 120 mg into the skin every 30 days. 3 mL 11   rizatriptan (MAXALT) 10 MG tablet Take 1 tablet by mouth as needed for migraine. 18 tablet 11   No current  facility-administered medications for this visit.    Allergies as of 04/27/2022 - Review Complete 04/27/2022  Allergen Reaction Noted   Iodine Nausea And Vomiting 07/06/2009   Penicillins Rash 07/06/2009    Vitals: BP 100/68 (BP Location: Left Arm, Patient Position: Sitting, Cuff Size: Normal)   Pulse 71   Ht '5\' 3"'$  (1.6 m)   Wt 151 lb 9.6 oz (68.8 kg)   BMI 26.85 kg/m  Last Weight:  Wt Readings from Last 1 Encounters:  04/27/22 151 lb 9.6 oz (68.8 kg)   Last Height:   Ht Readings from Last 1 Encounters:  04/27/22 '5\' 3"'$  (1.6 m)   Exam: NAD, pleasant                  Speech:    Speech is normal; fluent and spontaneous with normal comprehension.  Cognition:    The patient is oriented to person, place, and time;     recent and remote memory intact;     language fluent;    Cranial Nerves:    The pupils are equal, round, and reactive to light.Trigeminal sensation is intact and the muscles of mastication are normal. The face is symmetric. The palate elevates in the midline. Hearing intact. Voice is normal. Shoulder shrug is normal. The tongue has normal motion without fasciculations.   Coordination:  No dysmetria  Motor Observation:    No asymmetry, no atrophy, and no involuntary movements noted. Tone:    Normal muscle tone.     Strength:    Strength is V/V in the upper and lower limbs.      Sensation: intact to LT     Assessment/Plan: This is a patient with chronic migraines doing fantastic   Continue Emgality and Gabapentin.  trazodone at bedtime. Will also try nurtec.  Maxalt acutely.  Try Zavzpret and or elyxyb once daily as needed. Try nurtec: several options Try it every other day On days you think you may have a migraine just take it  Anothr thought would be to layer Qulipta onto what you have or consider botox again   Meds ordered this encounter  Medications   traZODone (DESYREL) 100 MG tablet    Sig: Take 1-2 tablets (100-200 mg total) by mouth at  bedtime.    Dispense:  180 tablet    Refill:  6   Galcanezumab-gnlm (EMGALITY) 120 MG/ML SOAJ    Sig: Inject 120 mg into the skin every 30 days.    Dispense:  3 mL    Refill:  11    Dispense a 3 month supply   gabapentin (NEURONTIN) 600 MG tablet    Sig: TAKE 1 TABLET BY MOUTH 3 TIMES DAILY    Dispense:  270 tablet    Refill:  3   rizatriptan (  MAXALT) 10 MG tablet    Sig: Take 1 tablet by mouth as needed for migraine.    Dispense:  18 tablet    Refill:  11   Rimegepant Sulfate (NURTEC) 75 MG TBDP    Sig: Take 75 mg by mouth daily as needed. For migraines. Take as close to onset of migraine as possible. One daily maximum.    Dispense:  16 tablet    Refill:  11    5 migraine days a month. Less than 10 total headache days a month. Failed maxalt, imitrex, naratriptan, zomig, relpax    Cc: Ria Bush, MD,  Ria Bush, MD  Sarina Ill, MD  [facility] [address] [place], [place] [address]  Phone [phone] Fax [phone]  I spent over 20 minutes of face-to-face and non-face-to-face time with patient on the  1. Chronic migraine without aura, with intractable migraine, so stated, with status migrainosus     diagnosis.  This included previsit chart review, lab review, study review, order entry, electronic health record documentation, patient education on the different diagnostic and therapeutic options, counseling and coordination of care, risks and benefits of management, compliance, or risk factor reduction

## 2022-04-27 NOTE — Patient Instructions (Addendum)
Continue Emgality and Gabapentin.  trazodone at bedtime. Will also try nurtec.  Maxalt acutely.  Try Zavzpret and or elyxyb once daily as needed. Try nurtec: several options Try it every other day On days you think you may have a migraine just take it  Anothr thought would be to layer Qulipta onto what you have or consider botox again  Meds ordered this encounter  Medications   traZODone (DESYREL) 100 MG tablet    Sig: Take 1-2 tablets (100-200 mg total) by mouth at bedtime.    Dispense:  180 tablet    Refill:  6   Galcanezumab-gnlm (EMGALITY) 120 MG/ML SOAJ    Sig: Inject 120 mg into the skin every 30 days.    Dispense:  3 mL    Refill:  11    Dispense a 3 month supply   gabapentin (NEURONTIN) 600 MG tablet    Sig: TAKE 1 TABLET BY MOUTH 3 TIMES DAILY    Dispense:  270 tablet    Refill:  3   rizatriptan (MAXALT) 10 MG tablet    Sig: Take 1 tablet by mouth as needed for migraine.    Dispense:  18 tablet    Refill:  11   Rimegepant Sulfate (NURTEC) 75 MG TBDP    Sig: Take 75 mg by mouth daily as needed. For migraines. Take as close to onset of migraine as possible. One daily maximum.    Dispense:  16 tablet    Refill:  11    5 migraine days a month. Less than 10 total headache days a month. Failed maxalt, imitrex, naratriptan, zomig, relpax

## 2022-05-05 ENCOUNTER — Other Ambulatory Visit (HOSPITAL_COMMUNITY): Payer: Self-pay

## 2022-05-08 ENCOUNTER — Telehealth: Payer: Self-pay | Admitting: Neurology

## 2022-05-08 NOTE — Telephone Encounter (Signed)
Remo Lipps is calling. Stated he is following up on the PA for Nurtec. Stated PA can be faxed to [phone].

## 2022-05-08 NOTE — Telephone Encounter (Signed)
Approved today The request has been approved. The authorization is effective from 05/08/2022 to 11/06/2022, as long as the member is enrolled in their current health plan. The request was approved with a quantity restriction. This has been approved for a max daily dosage of 0.6. A written notification letter will follow with additional details.  Faxed update of approval to Nurtec @ [phone] which is [place] [employer]. Received a receipt of confirmation.  Updated pt via mychart.

## 2022-05-08 NOTE — Telephone Encounter (Signed)
Nurtec PA is pending on CMM. Key: [id number]. Awaiting determination from [place].

## 2022-05-17 ENCOUNTER — Other Ambulatory Visit (HOSPITAL_COMMUNITY): Payer: Self-pay

## 2022-05-23 ENCOUNTER — Other Ambulatory Visit: Payer: Self-pay | Admitting: Neurology

## 2022-05-23 ENCOUNTER — Encounter: Payer: Self-pay | Admitting: Neurology

## 2022-05-23 MED ORDER — CLONAZEPAM 0.5 MG PO TBDP
0.5000 mg | ORAL_TABLET | Freq: Every evening | ORAL | 5 refills | Status: DC | PRN
Start: 2022-05-23 — End: 2022-07-12
  Filled 2022-05-23: qty 30, 30d supply, fill #0

## 2022-05-24 ENCOUNTER — Other Ambulatory Visit (HOSPITAL_COMMUNITY): Payer: Self-pay

## 2022-05-24 ENCOUNTER — Other Ambulatory Visit: Payer: Self-pay

## 2022-05-25 ENCOUNTER — Other Ambulatory Visit: Payer: Self-pay

## 2022-06-06 ENCOUNTER — Other Ambulatory Visit: Payer: Self-pay

## 2022-06-19 ENCOUNTER — Other Ambulatory Visit: Payer: Self-pay

## 2022-06-29 DIAGNOSIS — M8580 Other specified disorders of bone density and structure, unspecified site: Secondary | ICD-10-CM

## 2022-07-11 ENCOUNTER — Encounter: Payer: Self-pay | Admitting: Neurology

## 2022-07-12 ENCOUNTER — Other Ambulatory Visit: Payer: Self-pay | Admitting: Neurology

## 2022-07-12 MED ORDER — CLONAZEPAM 1 MG PO TABS
1.0000 mg | ORAL_TABLET | Freq: Two times a day (BID) | ORAL | 4 refills | Status: DC | PRN
  Filled 2022-07-12: qty 60, 30d supply, fill #0
  Filled 2022-10-29: qty 60, 30d supply, fill #1

## 2022-07-13 ENCOUNTER — Other Ambulatory Visit: Payer: Self-pay

## 2022-07-13 ENCOUNTER — Other Ambulatory Visit (HOSPITAL_COMMUNITY): Payer: Self-pay

## 2022-07-18 ENCOUNTER — Other Ambulatory Visit (HOSPITAL_COMMUNITY): Payer: Self-pay

## 2022-07-19 ENCOUNTER — Other Ambulatory Visit (HOSPITAL_COMMUNITY): Payer: Self-pay

## 2022-07-27 DIAGNOSIS — Z13820 Encounter for screening for osteoporosis: Secondary | ICD-10-CM | POA: Diagnosis not present

## 2022-07-27 LAB — HM DEXA SCAN

## 2022-07-29 ENCOUNTER — Other Ambulatory Visit (HOSPITAL_COMMUNITY): Payer: Self-pay

## 2022-07-31 ENCOUNTER — Other Ambulatory Visit (HOSPITAL_COMMUNITY): Payer: Self-pay

## 2022-07-31 ENCOUNTER — Other Ambulatory Visit: Payer: Self-pay

## 2022-08-01 ENCOUNTER — Other Ambulatory Visit: Payer: Self-pay

## 2022-08-01 ENCOUNTER — Other Ambulatory Visit (HOSPITAL_COMMUNITY): Payer: Self-pay

## 2022-08-09 ENCOUNTER — Other Ambulatory Visit (HOSPITAL_COMMUNITY): Payer: Self-pay

## 2022-08-10 ENCOUNTER — Other Ambulatory Visit: Payer: Self-pay

## 2022-08-10 ENCOUNTER — Other Ambulatory Visit (HOSPITAL_COMMUNITY): Payer: Self-pay

## 2022-08-10 MED ORDER — MONTELUKAST SODIUM 10 MG PO TABS
10.0000 mg | ORAL_TABLET | Freq: Every day | ORAL | 3 refills | Status: AC
  Filled 2022-08-10: qty 90, 90d supply, fill #0
  Filled 2022-11-12: qty 90, 90d supply, fill #1
  Filled 2023-01-31: qty 90, 90d supply, fill #2
  Filled 2023-05-07: qty 90, 90d supply, fill #3

## 2022-08-16 LAB — TSH: TSH: 1.05 (ref 0.41–5.90)

## 2022-08-16 LAB — BASIC METABOLIC PANEL
Creatinine: 0.8 (ref 0.5–1.1)
Glucose: 96
Potassium: 4 mEq/L (ref 3.5–5.1)
Sodium: 139 (ref 137–147)

## 2022-08-16 LAB — LIPID PANEL
Cholesterol: 178 (ref 0–200)
HDL: 67 (ref 35–70)
LDL Cholesterol: 101
Triglycerides: 51 (ref 40–160)

## 2022-08-16 LAB — HEPATIC FUNCTION PANEL
ALT: 13 U/L (ref 7–35)
AST: 18 (ref 13–35)
Alkaline Phosphatase: 71 (ref 25–125)
Bilirubin, Total: 0.6

## 2022-08-16 LAB — CBC AND DIFFERENTIAL
Hemoglobin: 13.9 (ref 12.0–16.0)
WBC: 5.2

## 2022-08-18 LAB — LAB REPORT - SCANNED
A1c: 5.4
EGFR: 83

## 2022-10-24 ENCOUNTER — Encounter: Payer: Self-pay | Admitting: Family Medicine

## 2022-10-25 ENCOUNTER — Telehealth: Payer: Self-pay

## 2022-10-25 ENCOUNTER — Other Ambulatory Visit (HOSPITAL_COMMUNITY): Payer: Self-pay

## 2022-10-25 NOTE — Telephone Encounter (Signed)
Spoke with patient. She confirmed nurtec works pretty consistently for her within 2 hours of taking it. She hasn't had to take it much but would love a refill. Of note, Dr Lucia Gaskins doesn't use the migraine-ACT questionnaire. I completed the PA on CMM and I'm waiting to hear back from [employer].   Key: Colvin Caroli

## 2022-10-25 NOTE — Telephone Encounter (Signed)
Received a renewal request via CMM-PT has not been seen since starting this medication-Please advise:

## 2022-10-25 NOTE — Telephone Encounter (Signed)
Spoke with patient. She confirmed it works pretty consistently for her within 2 hours of taking it. She hasn't required much of it but would love a refill.

## 2022-10-28 NOTE — Telephone Encounter (Signed)
Patient Advocate Encounter  Prior Authorization for Nurtec 75MG  dispersible tablets has been approved.    PA# [id number] Insurance [employer] ePA Form Effective dates: 10/27/2022 through 10/23/2023

## 2022-10-30 ENCOUNTER — Other Ambulatory Visit: Payer: Self-pay

## 2022-11-01 ENCOUNTER — Other Ambulatory Visit (HOSPITAL_COMMUNITY): Payer: Self-pay

## 2022-11-13 ENCOUNTER — Other Ambulatory Visit: Payer: Self-pay

## 2022-12-18 ENCOUNTER — Other Ambulatory Visit (HOSPITAL_COMMUNITY): Payer: Self-pay

## 2023-01-15 ENCOUNTER — Other Ambulatory Visit (HOSPITAL_COMMUNITY): Payer: Self-pay

## 2023-01-27 ENCOUNTER — Other Ambulatory Visit (HOSPITAL_COMMUNITY): Payer: Self-pay

## 2023-01-30 ENCOUNTER — Other Ambulatory Visit: Payer: Self-pay

## 2023-01-31 ENCOUNTER — Other Ambulatory Visit (HOSPITAL_COMMUNITY): Payer: Self-pay

## 2023-02-01 ENCOUNTER — Other Ambulatory Visit (HOSPITAL_COMMUNITY): Payer: Self-pay

## 2023-02-16 ENCOUNTER — Other Ambulatory Visit (HOSPITAL_COMMUNITY): Payer: Self-pay

## 2023-02-26 ENCOUNTER — Other Ambulatory Visit: Payer: Self-pay | Admitting: Neurology

## 2023-03-01 ENCOUNTER — Other Ambulatory Visit: Payer: Self-pay

## 2023-03-01 ENCOUNTER — Other Ambulatory Visit (HOSPITAL_COMMUNITY): Payer: Self-pay

## 2023-03-01 MED ORDER — CLONAZEPAM 1 MG PO TABS
1.0000 mg | ORAL_TABLET | Freq: Two times a day (BID) | ORAL | 4 refills | Status: DC | PRN
  Filled 2023-03-01 (×2): qty 60, 30d supply, fill #0

## 2023-03-01 NOTE — Telephone Encounter (Signed)
Requested Prescriptions   Pending Prescriptions Disp Refills   clonazePAM (KLONOPIN) 1 MG tablet 60 tablet 4    Sig: Take 1 tablet (1 mg total) by mouth 2 (two) times daily as needed for anxiety.   Last seen 04/27/22 Next appt 04/30/23  Dispenses   Dispensed Days Supply Quantity Provider Pharmacy  clonazePAM (KLONOPIN) 1 MG tablet 10/30/2022 30 60 tablet Anson Fret, MD [facility]  clonazePAM (KLONOPIN) 1 MG tablet 07/13/2022 30 60 tablet Anson Fret, MD [place] [facility]  clonazePAM (KLONOPIN) 0.5 MG disintegrating tablet 05/26/2022 30 30 tablet Anson Fret, MD [place] [facility]Marland KitchenMarland Kitchen

## 2023-04-02 ENCOUNTER — Other Ambulatory Visit (HOSPITAL_COMMUNITY): Payer: Self-pay

## 2023-04-02 ENCOUNTER — Other Ambulatory Visit: Payer: Self-pay

## 2023-04-03 ENCOUNTER — Other Ambulatory Visit: Payer: Self-pay

## 2023-04-05 ENCOUNTER — Other Ambulatory Visit (HOSPITAL_COMMUNITY): Payer: Self-pay

## 2023-04-05 MED ORDER — COVID-19 MRNA VAC-TRIS(PFIZER) 30 MCG/0.3ML IM SUSY
0.3000 mL | PREFILLED_SYRINGE | Freq: Once | INTRAMUSCULAR | 0 refills | Status: AC
  Filled 2023-04-05: qty 0.3, 1d supply, fill #0

## 2023-04-12 ENCOUNTER — Telehealth: Payer: Self-pay

## 2023-04-12 NOTE — Telephone Encounter (Signed)
*  [facility]  Pharmacy Patient Advocate Encounter   Received notification from CoverMyMeds that prior authorization for Emgality 120MG /ML auto-injectors (migraine)  is required/requested.   Insurance verification completed.   The patient is insured through [facility] .   Per test claim: PA required; PA submitted to above mentioned insurance via CoverMyMeds Key/confirmation #/EOC [id number] Status is pending

## 2023-04-16 ENCOUNTER — Other Ambulatory Visit (HOSPITAL_COMMUNITY): Payer: Self-pay

## 2023-04-16 ENCOUNTER — Other Ambulatory Visit: Payer: Self-pay

## 2023-04-20 NOTE — Telephone Encounter (Signed)
Pharmacy Patient Advocate Encounter  Received notification from [facility] that Prior Authorization for Emgality has been APPROVED from 04/16/2023 to 04/14/2024

## 2023-04-24 ENCOUNTER — Other Ambulatory Visit (HOSPITAL_COMMUNITY): Payer: Self-pay

## 2023-04-24 ENCOUNTER — Other Ambulatory Visit: Payer: Self-pay

## 2023-04-24 MED ORDER — PROGESTERONE MICRONIZED 100 MG PO CAPS
100.0000 mg | ORAL_CAPSULE | Freq: Every day | ORAL | 0 refills | Status: DC
  Filled 2023-04-24: qty 90, 90d supply, fill #0

## 2023-04-28 ENCOUNTER — Other Ambulatory Visit (HOSPITAL_COMMUNITY): Payer: Self-pay

## 2023-04-30 ENCOUNTER — Ambulatory Visit (INDEPENDENT_AMBULATORY_CARE_PROVIDER_SITE_OTHER): Payer: Commercial Managed Care - PPO | Admitting: Neurology

## 2023-04-30 ENCOUNTER — Other Ambulatory Visit: Payer: Self-pay

## 2023-04-30 ENCOUNTER — Other Ambulatory Visit (HOSPITAL_COMMUNITY): Payer: Self-pay

## 2023-04-30 ENCOUNTER — Encounter: Payer: Self-pay | Admitting: Neurology

## 2023-04-30 VITALS — BP 113/69 | HR 74 | Ht 63.0 in | Wt 136.0 lb

## 2023-04-30 DIAGNOSIS — G43009 Migraine without aura, not intractable, without status migrainosus: Secondary | ICD-10-CM | POA: Insufficient documentation

## 2023-04-30 DIAGNOSIS — G43711 Chronic migraine without aura, intractable, with status migrainosus: Secondary | ICD-10-CM | POA: Diagnosis not present

## 2023-04-30 MED ORDER — GABAPENTIN 600 MG PO TABS
600.0000 mg | ORAL_TABLET | Freq: Three times a day (TID) | ORAL | 3 refills | Status: AC
  Filled 2023-04-30 – 2023-05-03 (×2): qty 270, 90d supply, fill #0

## 2023-04-30 MED ORDER — EMGALITY 120 MG/ML ~~LOC~~ SOAJ
120.0000 mg | SUBCUTANEOUS | 11 refills | Status: DC
  Filled 2023-04-30: qty 3, 90d supply, fill #0
  Filled 2023-07-26: qty 3, 90d supply, fill #1
  Filled 2023-10-28: qty 3, 90d supply, fill #2
  Filled 2024-01-22: qty 3, 90d supply, fill #3
  Filled 2024-04-21: qty 1, 30d supply, fill #4

## 2023-04-30 MED ORDER — FLUOXETINE HCL 10 MG PO CAPS
10.0000 mg | ORAL_CAPSULE | Freq: Every day | ORAL | 3 refills | Status: AC
  Filled 2023-04-30 – 2023-05-03 (×2): qty 90, 90d supply, fill #0

## 2023-04-30 MED ORDER — RIZATRIPTAN BENZOATE 10 MG PO TABS
10.0000 mg | ORAL_TABLET | ORAL | 11 refills | Status: AC | PRN
  Filled 2023-04-30 – 2023-10-28 (×2): qty 18, 54d supply, fill #0
  Filled 2024-04-14: qty 18, 54d supply, fill #1

## 2023-04-30 MED ORDER — CLONAZEPAM 1 MG PO TABS
1.0000 mg | ORAL_TABLET | Freq: Two times a day (BID) | ORAL | 5 refills | Status: DC | PRN
  Filled 2023-04-30 – 2023-05-03 (×2): qty 60, 30d supply, fill #0
  Filled 2023-09-23: qty 60, 30d supply, fill #1

## 2023-04-30 MED ORDER — UBRELVY 100 MG PO TABS
100.0000 mg | ORAL_TABLET | ORAL | 11 refills | Status: AC | PRN
  Filled 2023-04-30 – 2023-05-03 (×2): qty 16, 30d supply, fill #0
  Filled 2023-07-26: qty 16, 30d supply, fill #1
  Filled 2023-09-23: qty 16, 30d supply, fill #2
  Filled 2024-01-22: qty 16, 30d supply, fill #3

## 2023-04-30 NOTE — Progress Notes (Signed)
[id number]    Provider:  Dr Lucia Gaskins Requesting Provider: Eustaquio Boyden, MD Primary Care Provider:  Eustaquio Boyden, MD  CC:  Chronic migraines  04/30/2023: Emgality approved unti l11/2025, nurtec approved until 09/2023. Ajovy gave her bad constipation. Emgality works. She only has 4-5 migraine days a month and < 10 total headace days a month. Emgality has been a life saver, > 60% improvement in migraine frequency and severity, nurtec didn't work acutely, will try ubrelvy,   Patient complains of symptoms per HPI as well as the following symptoms: insomnia . Pertinent negatives and positives per HPI. All others negative   04/27/2022: Emgality has been spectacular. She tried Ajovy. Emgality works better. Emgality and Gabapentin. Up to 5 or 6 migraines  month. Will give her trazodone at bedtime. Will also try nurtec. Maxalt acutely. Discussed. She has one child in college for films and she is very happy. He is in [employer].  Nurtec is not helpful. Will prescribe Ubrelvy.   Patient complains of symptoms per HPI as well as the following symptoms: migraine . Pertinent negatives and positives per HPI. 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  I reviewed prior neurology notes, patient was at the [facility] with Dr. Catalina Lunger, she reported her headaches unilateral but can be either side frontal and temporal, lasting 4 to 12 hours or all day, throbbing, sharp and dull, sensitivity to light, noise and worse with movement activities.  She has a rare aura of jagged lines, triggers include allergies, alcohol, fatigue, weather, wearing, medications, hormone changes and emotional stress.  Last being seen November 2020 she was doing well, taking gabapentin 3 times daily working well, as needed, patient's headaches related to hormone fluctuations, hysterectomy in November 2018, she  continues to take oral contraceptives to help with hormone levels, rarely takes Seroquel for relief during severe headaches, she also has had success taking Aleve and Robaxin together, she has not needed Maxalt recently, doing well with clonazepam, doing excellent with exercise.  Patient is here and she endorses over a year of severe headaches, over 15 migraine days a month, unilateral, throbbing, pulsating, nausea, photophobia, phonophobia, no medication overuse, no aura, they can last 24 to 72 hours.She uses seroquel very sparingly because it makes her sad the next day. She takes phenergan every once in a while. She didn;t like nurtec it made her feel weired, she is on very high dose burth control but she is having a hard time coming off of it. She gets little headaches twice a week like today with the weather maybe 2 decent headaches/migraines in a month, alleve and caffeine and water, being tired and exercising too much can trigger.  We had a very long discussion about options, the new technologies, the G pants and CGRP's, combinations of medications, Botox for migraines, oral medications, lifestyle factors, menstrual migraines and hormone fluctuations affecting migraines.  Spent an extended amount of time with patient discussing with her.  Reviewed notes, labs and imaging from outside physicians, which showed: List of medications patient has tried   ANALGESICS: Excedrin and Tylenol/Acetaminophen.  Anti-Migraine: Axert, Frova/Frovatriptan, Maxalt, Treximet and Zomig tablets/Zolmitriptan. Heart/BP: Lopressor/Toprol/Metoprolol and Tenormin/Atenolol.  Decongestant/Antihistamine: Flonase.  Anti-Nauseant: Phenergan/Promethazine.  NSAID's: Celebrex and Mobic/Meloxicam.  Muscle Relaxants: Flexeril/Cyclobenzaprine.  Anti-Convulsants: Topamax/Topiramate, Gabapentin  Steriods: Prednisone.  Sleeping Pills/Tranquilizers: Ambien/Cr/Zolipem, Lunesta/Eszopiclone, Seroquel/Quetiapine and Sonata/Zaleplon.   Anti-Depressants: Elavil/Amitriptylline and Sinequan/Doxepin.  Hormonal: Alesse  and Yasmine.  Other medications/therapies: Trigger Point injections, Ajovy, Aimovig contraindicated due to constipation, Emgality is excellent.  Maxalt, gabapentin, nurtec  Review of Systems: Patient complains of symptoms per HPI as well as the following symptoms: none. Pertinent negatives and positives per HPI. All others negative.   Social History   Socioeconomic History   Marital status: Married    Spouse name: Not on file   Number of children: 1   Years of education: Not on file   Highest education level: Professional school degree (e.g., MD, DDS, DVM, JD)  Occupational History   Not on file  Tobacco Use   Smoking status: Never   Smokeless tobacco: Never  Vaping Use   Vaping status: Never Used  Substance and Sexual Activity   Alcohol use: Yes    Comment: 2-3 times per week   Drug use: No   Sexual activity: Yes    Birth control/protection: Pill  Other Topics Concern   Not on file  Social History Narrative   Lives with husband, son   Caffeine-1 coffee daily   Right Handed   Social Determinants of Health   Financial Resource Strain: Not on file  Food Insecurity: Not on file  Transportation Needs: Not on file  Physical Activity: Not on file  Stress: Not on file  Social Connections: Unknown (10/09/2021)   Received from [facility], [facility]   Social Network    Social Network: Not on file  Intimate Partner Violence: Unknown (08/31/2021)   Received from [facility], [facility]   HITS    Physically Hurt: Not on file    Insult or Talk Down To: Not on file    Threaten Physical Harm: Not on file    Scream or Curse: Not on file    Family History  Problem Relation Age of Onset   Lupus Mother    Kidney failure Mother    Hypertension Mother    Heart failure Mother    Alcoholism Mother    Alcoholism Father    Pancreatic cancer Father    Breast cancer Paternal Aunt      Past Medical History:  Diagnosis Date   Allergic rhinitis    Anxiety    Asthma    childhood   Gallstones    GERD (gastroesophageal reflux disease)    Insomnia    MENST MIGRAINE W/INTRACT W/O STATUS MIGRAINOSUS 07/16/2007   Qualifier: Diagnosis of  By: Hetty Ely MD, Franne Grip    Osteopenia 06/2022   T -1.3 spine ([facility]) rpt 2 yrs   SLE (systemic lupus erythematosus) (HCC)    no medication at this time    Patient Active Problem List   Diagnosis Date Noted   Chronic migraine without aura, with intractable migraine, so stated, with status migrainosus 04/30/2023   Migraine without aura and without status migrainosus, not intractable 04/30/2023   Osteopenia 06/2022   Post-menopause on HRT (hormone replacement therapy) 11/04/2021   Anxiety 11/04/2021   Chronic insomnia 11/04/2021   SLE (systemic lupus erythematosus) (HCC) 11/04/2021   Chronic migraine without aura without status migrainosus, not intractable 03/29/2020   Fibroids 04/04/2017   Rotator cuff syndrome of right shoulder 01/21/2017   Pain in both knees 03/09/2016   Cholelithiasis 10/10/2013    Past Surgical History:  Procedure Laterality Date   ABDOMINAL HYSTERECTOMY N/A 04/04/2017   Procedure: HYSTERECTOMY ABDOMINAL;  Surgeon: Marcelle Overlie, MD;  Location: [facility];  Service: Gynecology;  Laterality: N/A;   BILATERAL SALPINGECTOMY Bilateral 04/04/2017   Procedure: BILATERAL  SALPINGECTOMY;  Surgeon: Marcelle Overlie, MD;  Location: [facility];  Service: Gynecology;  Laterality: Bilateral;   BREAST SURGERY  2010   reduction-bilat   CHOLECYSTECTOMY N/A 11/12/2013   Procedure: LAPAROSCOPIC CHOLECYSTECTOMY;  Surgeon: Clovis Pu. Cornett, MD;  Location: [facility];  Service: General;  Laterality: N/A;   LAPAROSCOPIC CHOLECYSTECTOMY  10/2013   (Cornett)   TONSILLECTOMY     URETHRAL DILATION      Current Outpatient Medications  Medication Sig Dispense Refill   cetirizine (ZYRTEC) 10 MG tablet Take 10 mg by  mouth daily.     estradiol (VIVELLE-DOT) 0.05 MG/24HR patch Apply 1 patch twice a week 24 patch 4   FLUoxetine (PROZAC) 10 MG capsule Take 1 capsule (10 mg total) by mouth daily. 90 capsule 3   Magnesium 500 MG TABS Take 1 tablet (500 mg total) by mouth at bedtime.     montelukast (SINGULAIR) 10 MG tablet Take 1 tablet (10 mg total) by mouth daily. 90 tablet 3   progesterone (PROMETRIUM) 100 MG capsule Take 1 capsule (100 mg total) by mouth at bedtime. 90 capsule 0   Ubrogepant (UBRELVY) 100 MG TABS Take 1 tablet (100 mg total) by mouth every 2 (two) hours as needed. Maximum 200mg  a day. 16 tablet 11   clonazePAM (KLONOPIN) 1 MG tablet Take 1 tablet (1 mg total) by mouth 2 (two) times daily as needed for anxiety. 60 tablet 5   gabapentin (NEURONTIN) 600 MG tablet Take 1 tablet (600 mg total) by mouth 3 (three) times daily. 270 tablet 3   Galcanezumab-gnlm (EMGALITY) 120 MG/ML SOAJ Inject 120 mg into the skin every 30 days. 3 mL 11   rizatriptan (MAXALT) 10 MG tablet Take 1 tablet by mouth as needed for migraine. 18 tablet 11   No current facility-administered medications for this visit.    Allergies as of 04/30/2023 - Review Complete 04/30/2023  Allergen Reaction Noted   Iodine Nausea And Vomiting 07/06/2009   Penicillins Rash 07/06/2009    Vitals: BP 113/69   Pulse 74   Ht 5\' 3"  (1.6 m)   Wt 136 lb (61.7 kg)   BMI 24.09 kg/m  Last Weight:  Wt Readings from Last 1 Encounters:  04/30/23 136 lb (61.7 kg)   Last Height:   Ht Readings from Last 1 Encounters:  04/30/23 5\' 3"  (1.6 m)  Exam: NAD, pleasant                  Speech:    Speech is normal; fluent and spontaneous with normal comprehension.  Cognition:    The patient is oriented to person, place, and time;     recent and remote memory intact;     language fluent;    Cranial Nerves:    The pupils are equal, round, and reactive to light.Trigeminal sensation is intact and the muscles of mastication are normal. The face is  symmetric. The palate elevates in the midline. Hearing intact. Voice is normal. Shoulder shrug is normal. The tongue has normal motion without fasciculations.   Coordination:  No dysmetria  Motor Observation:    No asymmetry, no atrophy, and no involuntary movements noted. Tone:    Normal muscle tone.     Strength:    Strength is V/V in the upper and lower limbs.      Sensation: intact to LT        Assessment/Plan: This is a patient with chronic migraines doing fantastic on Emgality for her chronic migraines the  emgality has improved   Continue Emgality and Gabapentin.  Will also try ubrelvy.  Maxalt acutely.  Try Zavzpret and or elyxyb once daily as needed.(Samples) Nurtec not working Klonopin for insomnia, been on it for years Prozac for OCD  Anothr thought would be to layer Qulipta onto what you have or consider botox again  Meds ordered this encounter  Medications   Ubrogepant (UBRELVY) 100 MG TABS    Sig: Take 1 tablet (100 mg total) by mouth every 2 (two) hours as needed. Maximum 200mg  a day.    Dispense:  16 tablet    Refill:  11    PLESE USE COPAY CARD: BIN [id number] PCN 54 ID [id number] GRP: [id number]   rizatriptan (MAXALT) 10 MG tablet    Sig: Take 1 tablet by mouth as needed for migraine.    Dispense:  18 tablet    Refill:  11   gabapentin (NEURONTIN) 600 MG tablet    Sig: Take 1 tablet (600 mg total) by mouth 3 (three) times daily.    Dispense:  270 tablet    Refill:  3    Please don;t fill keep on record until needed.   clonazePAM (KLONOPIN) 1 MG tablet    Sig: Take 1 tablet (1 mg total) by mouth 2 (two) times daily as needed for anxiety.    Dispense:  60 tablet    Refill:  5   FLUoxetine (PROZAC) 10 MG capsule    Sig: Take 1 capsule (10 mg total) by mouth daily.    Dispense:  90 capsule    Refill:  3   Galcanezumab-gnlm (EMGALITY) 120 MG/ML SOAJ    Sig: Inject 120 mg into the skin every 30 days.    Dispense:  3 mL    Refill:  11    Dispense a 3  month supply      Meds ordered this encounter  Medications   Ubrogepant (UBRELVY) 100 MG TABS    Sig: Take 1 tablet (100 mg total) by mouth every 2 (two) hours as needed. Maximum 200mg  a day.    Dispense:  16 tablet    Refill:  11    PLESE USE COPAY CARD: BIN [id number] PCN 54 ID [id number] GRP: [id number]   rizatriptan (MAXALT) 10 MG tablet    Sig: Take 1 tablet by mouth as needed for migraine.    Dispense:  18 tablet    Refill:  11   gabapentin (NEURONTIN) 600 MG tablet    Sig: Take 1 tablet (600 mg total) by mouth 3 (three) times daily.    Dispense:  270 tablet    Refill:  3    Please don;t fill keep on record until needed.   clonazePAM (KLONOPIN) 1 MG tablet    Sig: Take 1 tablet (1 mg total) by mouth 2 (two) times daily as needed for anxiety.    Dispense:  60 tablet    Refill:  5   FLUoxetine (PROZAC) 10 MG capsule    Sig: Take 1 capsule (10 mg total) by mouth daily.    Dispense:  90 capsule    Refill:  3   Galcanezumab-gnlm (EMGALITY) 120 MG/ML SOAJ    Sig: Inject 120 mg into the skin every 30 days.    Dispense:  3 mL    Refill:  11    Dispense a 3 month supply    Cc: Eustaquio Boyden, MD,  Eustaquio Boyden, MD  Naomie Dean, MD  [facility] [address]  [address] [place], [place] [address]  Phone [phone] Fax [phone]  I spent over 20 minutes of face-to-face and non-face-to-face time with patient on the  1. Chronic migraine without aura, with intractable migraine, so stated, with status migrainosus   2. Migraine without aura and without status migrainosus, not intractable      diagnosis.  This included previsit chart review, lab review, study review, order entry, electronic health record documentation, patient education on the different diagnostic and therapeutic options, counseling and coordination of care, risks and benefits of management, compliance, or risk factor reduction

## 2023-05-03 ENCOUNTER — Other Ambulatory Visit (HOSPITAL_COMMUNITY): Payer: Self-pay

## 2023-05-04 ENCOUNTER — Other Ambulatory Visit (HOSPITAL_COMMUNITY): Payer: Self-pay

## 2023-05-07 ENCOUNTER — Telehealth: Payer: Self-pay

## 2023-05-07 DIAGNOSIS — Z1231 Encounter for screening mammogram for malignant neoplasm of breast: Secondary | ICD-10-CM | POA: Diagnosis not present

## 2023-05-07 DIAGNOSIS — K581 Irritable bowel syndrome with constipation: Secondary | ICD-10-CM | POA: Diagnosis not present

## 2023-05-07 DIAGNOSIS — J302 Other seasonal allergic rhinitis: Secondary | ICD-10-CM | POA: Diagnosis not present

## 2023-05-07 DIAGNOSIS — Z01419 Encounter for gynecological examination (general) (routine) without abnormal findings: Secondary | ICD-10-CM | POA: Diagnosis not present

## 2023-05-07 DIAGNOSIS — Z6823 Body mass index (BMI) 23.0-23.9, adult: Secondary | ICD-10-CM | POA: Diagnosis not present

## 2023-05-07 DIAGNOSIS — N951 Menopausal and female climacteric states: Secondary | ICD-10-CM | POA: Diagnosis not present

## 2023-05-07 NOTE — Telephone Encounter (Signed)
*  [facility]  Pharmacy Patient Advocate Encounter   Received notification from CoverMyMeds that prior authorization for Ubrelvy 100MG  tablets  is required/requested.   Insurance verification completed.   The patient is insured through [facility] .   Per test claim: PA required; PA submitted to above mentioned insurance via CoverMyMeds Key/confirmation #/EOC [id number] Status is pending

## 2023-05-08 ENCOUNTER — Other Ambulatory Visit: Payer: Self-pay

## 2023-05-08 ENCOUNTER — Other Ambulatory Visit (HOSPITAL_COMMUNITY): Payer: Self-pay

## 2023-05-08 MED ORDER — ESTRADIOL 0.05 MG/24HR TD PTTW
1.0000 | MEDICATED_PATCH | TRANSDERMAL | 4 refills | Status: AC
Start: 2023-05-07 — End: ?
  Filled 2023-06-17: qty 24, 84d supply, fill #0
  Filled 2023-09-09: qty 24, 84d supply, fill #1
  Filled 2023-12-08: qty 24, 84d supply, fill #2
  Filled 2024-02-27: qty 24, 84d supply, fill #3

## 2023-05-08 MED ORDER — MONTELUKAST SODIUM 10 MG PO TABS
10.0000 mg | ORAL_TABLET | Freq: Every day | ORAL | 3 refills | Status: AC
  Filled 2023-05-08 – 2023-08-07 (×2): qty 90, 90d supply, fill #0

## 2023-05-08 MED ORDER — LINZESS 145 MCG PO CAPS
145.0000 ug | ORAL_CAPSULE | Freq: Every day | ORAL | 3 refills | Status: AC
  Filled 2023-05-08: qty 30, 30d supply, fill #0
  Filled 2023-09-23: qty 30, 30d supply, fill #1

## 2023-05-09 ENCOUNTER — Other Ambulatory Visit (HOSPITAL_COMMUNITY): Payer: Self-pay

## 2023-05-09 NOTE — Telephone Encounter (Signed)
Pharmacy Patient Advocate Encounter  Received notification from [facility] that Prior Authorization for Ubrelvy 100MG  tablets  has been APPROVED from 05/08/2023 to 11/04/2023. Ran test claim, Copay is $125.00. This test claim was processed through [facility]- copay amounts may vary at other pharmacies due to pharmacy/plan contracts, or as the patient moves through the different stages of their insurance plan.   PA #/Case ID/Reference #: [id number]

## 2023-05-31 DIAGNOSIS — H5213 Myopia, bilateral: Secondary | ICD-10-CM | POA: Diagnosis not present

## 2023-06-18 ENCOUNTER — Other Ambulatory Visit: Payer: Self-pay

## 2023-07-23 ENCOUNTER — Other Ambulatory Visit (HOSPITAL_COMMUNITY): Payer: Self-pay

## 2023-07-24 ENCOUNTER — Other Ambulatory Visit (HOSPITAL_COMMUNITY): Payer: Self-pay

## 2023-07-24 ENCOUNTER — Other Ambulatory Visit: Payer: Self-pay

## 2023-07-24 MED ORDER — PROGESTERONE MICRONIZED 100 MG PO CAPS
100.0000 mg | ORAL_CAPSULE | Freq: Every evening | ORAL | 3 refills | Status: DC
  Filled 2023-07-24: qty 90, 90d supply, fill #0
  Filled 2023-10-28: qty 90, 90d supply, fill #1
  Filled 2024-01-23: qty 90, 90d supply, fill #2
  Filled 2024-04-21: qty 90, 90d supply, fill #3

## 2023-07-27 ENCOUNTER — Other Ambulatory Visit: Payer: Self-pay

## 2023-08-07 ENCOUNTER — Other Ambulatory Visit (HOSPITAL_COMMUNITY): Payer: Self-pay

## 2023-08-07 ENCOUNTER — Other Ambulatory Visit: Payer: Self-pay

## 2023-09-07 LAB — LAB REPORT - SCANNED
A1c: 5.3
EGFR: 83
TSH: 1.03 (ref 0.41–5.90)

## 2023-09-10 ENCOUNTER — Other Ambulatory Visit: Payer: Self-pay

## 2023-09-24 ENCOUNTER — Other Ambulatory Visit: Payer: Self-pay

## 2023-10-23 ENCOUNTER — Telehealth: Payer: Self-pay | Admitting: Pharmacist

## 2023-10-23 NOTE — Telephone Encounter (Signed)
 Pharmacy Patient Advocate Encounter  Received notification from [facility] that Prior Authorization for Ubrelvy  100MG  tablets has been APPROVED from 10/23/2023 to 10/22/2024   PA #/Case ID/Reference #:  [id number]

## 2023-10-23 NOTE — Telephone Encounter (Signed)
 Pharmacy Patient Advocate Encounter   Received notification from CoverMyMeds that prior authorization for Ubrelvy  100MG  tablets is required/requested.   Insurance verification completed.   The patient is insured through [facility] .   Per test claim: PA required; PA submitted to above mentioned insurance via CoverMyMeds Key/confirmation #/EOC [id number] Status is pending

## 2023-10-29 ENCOUNTER — Other Ambulatory Visit (HOSPITAL_COMMUNITY): Payer: Self-pay

## 2023-10-29 ENCOUNTER — Other Ambulatory Visit: Payer: Self-pay

## 2023-12-10 ENCOUNTER — Other Ambulatory Visit (HOSPITAL_COMMUNITY): Payer: Self-pay

## 2024-01-01 ENCOUNTER — Other Ambulatory Visit (HOSPITAL_COMMUNITY): Payer: Self-pay

## 2024-01-01 ENCOUNTER — Telehealth: Payer: Self-pay | Admitting: Family Medicine

## 2024-01-01 ENCOUNTER — Other Ambulatory Visit: Payer: Self-pay

## 2024-01-01 MED ORDER — FLUCONAZOLE 150 MG PO TABS
150.0000 mg | ORAL_TABLET | Freq: Once | ORAL | 0 refills | Status: AC
  Filled 2024-01-01: qty 2, 3d supply, fill #0

## 2024-01-01 NOTE — Telephone Encounter (Signed)
 Symptoms of yeast infection Topical OTC treatment irritates skin ERx diflucan  150mg  with repeat in 3d if needed Update if ongoing symptoms despite treatment

## 2024-01-22 ENCOUNTER — Other Ambulatory Visit: Payer: Self-pay

## 2024-01-23 ENCOUNTER — Other Ambulatory Visit: Payer: Self-pay

## 2024-01-24 ENCOUNTER — Other Ambulatory Visit (HOSPITAL_COMMUNITY): Payer: Self-pay

## 2024-02-08 ENCOUNTER — Other Ambulatory Visit (HOSPITAL_COMMUNITY): Payer: Self-pay

## 2024-02-08 ENCOUNTER — Other Ambulatory Visit: Payer: Self-pay

## 2024-02-08 MED ORDER — IBUPROFEN 400 MG PO TABS
400.0000 mg | ORAL_TABLET | ORAL | 0 refills | Status: AC
  Filled 2024-02-08 (×2): qty 30, 5d supply, fill #0

## 2024-02-08 MED ORDER — DOXYCYCLINE HYCLATE 100 MG PO CAPS
100.0000 mg | ORAL_CAPSULE | Freq: Two times a day (BID) | ORAL | 0 refills | Status: AC
  Filled 2024-02-08 (×2): qty 10, 5d supply, fill #0

## 2024-02-08 MED ORDER — CHLORHEXIDINE GLUCONATE 0.12 % MT SOLN
OROMUCOSAL | 0 refills | Status: AC
  Filled 2024-02-08: qty 473, 30d supply, fill #0
  Filled 2024-02-08: qty 473, 7d supply, fill #0

## 2024-02-08 MED ORDER — DEXAMETHASONE 4 MG PO TABS
4.0000 mg | ORAL_TABLET | Freq: Three times a day (TID) | ORAL | 0 refills | Status: AC | PRN
  Filled 2024-02-08 (×2): qty 9, 3d supply, fill #0

## 2024-02-27 ENCOUNTER — Other Ambulatory Visit: Payer: Self-pay

## 2024-03-05 ENCOUNTER — Telehealth: Payer: Self-pay | Admitting: Neurology

## 2024-03-05 NOTE — Telephone Encounter (Signed)
 Called and left voicemail for patient to reschedule appointment on 05/01/24 with Dr Ines.  If patient calls back, they can be rescheduled with Dr. Chalice

## 2024-03-27 ENCOUNTER — Other Ambulatory Visit: Payer: Self-pay

## 2024-03-31 ENCOUNTER — Other Ambulatory Visit: Payer: Self-pay

## 2024-03-31 ENCOUNTER — Other Ambulatory Visit (HOSPITAL_COMMUNITY): Payer: Self-pay

## 2024-03-31 MED ORDER — CLONAZEPAM 1 MG PO TABS
1.0000 mg | ORAL_TABLET | Freq: Two times a day (BID) | ORAL | 5 refills | Status: AC | PRN
  Filled 2024-03-31: qty 60, 30d supply, fill #0

## 2024-04-01 ENCOUNTER — Other Ambulatory Visit (HOSPITAL_COMMUNITY): Payer: Self-pay

## 2024-04-14 ENCOUNTER — Other Ambulatory Visit: Payer: Self-pay

## 2024-04-21 ENCOUNTER — Other Ambulatory Visit: Payer: Self-pay

## 2024-04-21 ENCOUNTER — Other Ambulatory Visit (HOSPITAL_COMMUNITY): Payer: Self-pay

## 2024-04-22 ENCOUNTER — Other Ambulatory Visit (HOSPITAL_COMMUNITY): Payer: Self-pay

## 2024-04-22 ENCOUNTER — Telehealth: Payer: Self-pay

## 2024-04-22 NOTE — Telephone Encounter (Signed)
 Pharmacy Patient Advocate Encounter   Received notification from CoverMyMeds that prior authorization for Emgality  120mg /ml autoinjector is required/requested.   Insurance verification completed.   The patient is insured through [facility].   Per test claim: PA required; PA submitted to above mentioned insurance via Latent Key/confirmation #/EOC [id number] Status is pending

## 2024-04-25 ENCOUNTER — Other Ambulatory Visit (HOSPITAL_COMMUNITY): Payer: Self-pay

## 2024-04-25 NOTE — Telephone Encounter (Signed)
 Pharmacy Patient Advocate Encounter  Received notification from [facility] that Prior Authorization for Emgality  120mg /ml autoinjector  has been APPROVED from 04/22/2024 to 04/21/2025. Ran test claim, Copay is $35.00. This test claim was processed through [facility]- copay amounts may vary at other pharmacies due to pharmacy/plan contracts, or as the patient moves through the different stages of their insurance plan.   PA #/Case ID/Reference #: [id number]   Run it as 1ml per 30DS to get a paid claim.

## 2024-05-01 ENCOUNTER — Ambulatory Visit: Payer: Commercial Managed Care - PPO | Admitting: Neurology

## 2024-05-12 ENCOUNTER — Other Ambulatory Visit (HOSPITAL_COMMUNITY): Payer: Self-pay

## 2024-05-15 ENCOUNTER — Other Ambulatory Visit (HOSPITAL_COMMUNITY): Payer: Self-pay

## 2024-05-15 ENCOUNTER — Other Ambulatory Visit: Payer: Self-pay

## 2024-05-15 DIAGNOSIS — Z01419 Encounter for gynecological examination (general) (routine) without abnormal findings: Secondary | ICD-10-CM | POA: Diagnosis not present

## 2024-05-15 DIAGNOSIS — Z1272 Encounter for screening for malignant neoplasm of vagina: Secondary | ICD-10-CM | POA: Diagnosis not present

## 2024-05-15 DIAGNOSIS — Z6823 Body mass index (BMI) 23.0-23.9, adult: Secondary | ICD-10-CM | POA: Diagnosis not present

## 2024-05-15 DIAGNOSIS — Z1231 Encounter for screening mammogram for malignant neoplasm of breast: Secondary | ICD-10-CM | POA: Diagnosis not present

## 2024-05-15 MED ORDER — PROGESTERONE MICRONIZED 100 MG PO CAPS
100.0000 mg | ORAL_CAPSULE | Freq: Every day | ORAL | 4 refills | Status: AC
  Filled 2024-05-15: qty 90, 90d supply, fill #0

## 2024-05-15 MED ORDER — ESTRADIOL 0.01 % VA CREA
0.5000 g | TOPICAL_CREAM | VAGINAL | 3 refills | Status: AC
  Filled 2024-05-15: qty 42.5, 90d supply, fill #0

## 2024-05-15 MED ORDER — ESTRADIOL 0.05 MG/24HR TD PTTW
1.0000 | MEDICATED_PATCH | TRANSDERMAL | 4 refills | Status: AC
  Filled 2024-05-15: qty 24, 84d supply, fill #0

## 2024-05-16 ENCOUNTER — Other Ambulatory Visit: Payer: Self-pay

## 2024-05-16 ENCOUNTER — Encounter: Payer: Self-pay | Admitting: Pharmacist

## 2024-05-16 ENCOUNTER — Other Ambulatory Visit (HOSPITAL_COMMUNITY): Payer: Self-pay

## 2024-05-19 ENCOUNTER — Other Ambulatory Visit (HOSPITAL_COMMUNITY): Payer: Self-pay

## 2024-05-19 ENCOUNTER — Other Ambulatory Visit: Payer: Self-pay

## 2024-06-25 ENCOUNTER — Other Ambulatory Visit: Payer: Self-pay

## 2024-06-25 ENCOUNTER — Telehealth: Payer: Self-pay | Admitting: Neurology

## 2024-06-25 NOTE — Telephone Encounter (Signed)
 LVM and sent mychart informing pt of reschedule needed. MD OUT

## 2024-06-26 ENCOUNTER — Other Ambulatory Visit (HOSPITAL_COMMUNITY): Payer: Self-pay

## 2024-06-30 ENCOUNTER — Encounter (HOSPITAL_COMMUNITY): Payer: Self-pay

## 2024-07-01 ENCOUNTER — Other Ambulatory Visit: Payer: Self-pay

## 2024-07-01 ENCOUNTER — Other Ambulatory Visit: Payer: Self-pay | Admitting: Neurology

## 2024-07-01 ENCOUNTER — Other Ambulatory Visit (HOSPITAL_COMMUNITY): Payer: Self-pay

## 2024-07-01 DIAGNOSIS — G43711 Chronic migraine without aura, intractable, with status migrainosus: Secondary | ICD-10-CM

## 2024-07-01 MED ORDER — EMGALITY 120 MG/ML ~~LOC~~ SOAJ
120.0000 mg | SUBCUTANEOUS | 11 refills | Status: AC
  Filled 2024-07-01: qty 3, 90d supply, fill #0

## 2024-08-12 ENCOUNTER — Ambulatory Visit: Admitting: Neurology
